# Patient Record
Sex: Male | Born: 1975 | Race: Asian | Hispanic: No | Marital: Married | Smoking: Never smoker
Health system: Southern US, Community
[De-identification: ages and names within clinical notes are randomized; demographics above are authoritative.]

## PROBLEM LIST (undated history)

## (undated) DIAGNOSIS — I10 Essential (primary) hypertension: Secondary | ICD-10-CM

## (undated) DIAGNOSIS — M549 Dorsalgia, unspecified: Secondary | ICD-10-CM

## (undated) DIAGNOSIS — K219 Gastro-esophageal reflux disease without esophagitis: Secondary | ICD-10-CM

## (undated) DIAGNOSIS — G8929 Other chronic pain: Secondary | ICD-10-CM

---

## 2009-11-26 ENCOUNTER — Emergency Department (HOSPITAL_COMMUNITY): Admission: EM | Admit: 2009-11-26 | Discharge: 2009-11-26 | Payer: Self-pay | Admitting: Emergency Medicine

## 2010-02-01 ENCOUNTER — Emergency Department (HOSPITAL_COMMUNITY)
Admission: EM | Admit: 2010-02-01 | Discharge: 2010-02-01 | Payer: Self-pay | Source: Home / Self Care | Admitting: Emergency Medicine

## 2010-02-07 DEATH — deceased

## 2010-02-20 ENCOUNTER — Emergency Department (HOSPITAL_COMMUNITY)
Admission: EM | Admit: 2010-02-20 | Discharge: 2010-02-20 | Payer: Self-pay | Source: Home / Self Care | Admitting: Emergency Medicine

## 2010-02-22 LAB — POCT URINALYSIS DIPSTICK
Bilirubin Urine: NEGATIVE
Hgb urine dipstick: NEGATIVE
Ketones, ur: NEGATIVE mg/dL
Nitrite: NEGATIVE
Protein, ur: NEGATIVE mg/dL
Specific Gravity, Urine: 1.025 (ref 1.005–1.030)
Urine Glucose, Fasting: NEGATIVE mg/dL
Urobilinogen, UA: 0.2 mg/dL (ref 0.0–1.0)
pH: 6 (ref 5.0–8.0)

## 2010-03-12 ENCOUNTER — Inpatient Hospital Stay (INDEPENDENT_AMBULATORY_CARE_PROVIDER_SITE_OTHER)
Admission: RE | Admit: 2010-03-12 | Discharge: 2010-03-12 | Disposition: A | Discharge status: Home / Self Care | Payer: Medicaid Other | Source: Ambulatory Visit | Attending: Family Medicine | Admitting: Family Medicine

## 2010-03-12 DIAGNOSIS — K649 Unspecified hemorrhoids: Secondary | ICD-10-CM

## 2010-04-19 LAB — COMPREHENSIVE METABOLIC PANEL
ALT: 32 U/L (ref 0–53)
AST: 31 U/L (ref 0–37)
Albumin: 3.7 g/dL (ref 3.5–5.2)
Alkaline Phosphatase: 86 U/L (ref 39–117)
BUN: 10 mg/dL (ref 6–23)
CO2: 26 mEq/L (ref 19–32)
Calcium: 9.3 mg/dL (ref 8.4–10.5)
Chloride: 107 mEq/L (ref 96–112)
Creatinine, Ser: 1.18 mg/dL (ref 0.4–1.5)
GFR calc Af Amer: >60 mL/min (ref >60)
GFR calc non Af Amer: >60 mL/min (ref >60)
Glucose, Bld: 86 mg/dL (ref 70–99)
Potassium: 3.6 mEq/L (ref 3.5–5.1)
Sodium: 139 mEq/L (ref 135–145)
Total Bilirubin: 1.1 mg/dL (ref 0.3–1.2)
Total Protein: 7.2 g/dL (ref 6.0–8.3)

## 2010-04-19 LAB — DIFFERENTIAL
Basophils Absolute: 0 10*3/uL (ref 0.0–0.1)
Basophils Relative: 0 % (ref 0–1)
Eosinophils Absolute: 0.3 10*3/uL (ref 0.0–0.7)
Eosinophils Relative: 4 % (ref 0–5)
Lymphocytes Relative: 43 % (ref 12–46)
Lymphs Abs: 3.3 10*3/uL (ref 0.7–4.0)
Monocytes Absolute: 0.5 10*3/uL (ref 0.1–1.0)
Monocytes Relative: 7 % (ref 3–12)
Neutro Abs: 3.6 10*3/uL (ref 1.7–7.7)
Neutrophils Relative %: 46 % (ref 43–77)

## 2010-04-19 LAB — URINALYSIS, ROUTINE W REFLEX MICROSCOPIC
Bilirubin Urine: NEGATIVE
Glucose, UA: NEGATIVE mg/dL
Hgb urine dipstick: NEGATIVE
Ketones, ur: NEGATIVE mg/dL
Nitrite: NEGATIVE
Protein, ur: NEGATIVE mg/dL
Specific Gravity, Urine: 1.016 (ref 1.005–1.030)
Urobilinogen, UA: 0.2 mg/dL (ref 0.0–1.0)
pH: 6 (ref 5.0–8.0)

## 2010-04-19 LAB — CBC
HCT: 47.6 % (ref 39.0–52.0)
Hemoglobin: 16.6 g/dL (ref 13.0–17.0)
MCH: 30.3 pg (ref 26.0–34.0)
MCHC: 34.9 g/dL (ref 30.0–36.0)
MCV: 87 fL (ref 78.0–100.0)
Platelets: 186 10*3/uL (ref 150–400)
RBC: 5.47 MIL/uL (ref 4.22–5.81)
RDW: 12.7 % (ref 11.5–15.5)
WBC: 7.7 10*3/uL (ref 4.0–10.5)

## 2010-04-19 LAB — LIPASE, BLOOD: Lipase: 29 U/L (ref 11–59)

## 2010-04-21 LAB — CBC
HCT: 47.4 % (ref 39.0–52.0)
Hemoglobin: 16.5 g/dL (ref 13.0–17.0)
MCH: 29.9 pg (ref 26.0–34.0)
MCHC: 34.8 g/dL (ref 30.0–36.0)
MCV: 86 fL (ref 78.0–100.0)
Platelets: 200 10*3/uL (ref 150–400)
RBC: 5.51 MIL/uL (ref 4.22–5.81)
RDW: 13 % (ref 11.5–15.5)
WBC: 9.2 10*3/uL (ref 4.0–10.5)

## 2010-04-21 LAB — URINALYSIS, ROUTINE W REFLEX MICROSCOPIC
Bilirubin Urine: NEGATIVE
Glucose, UA: NEGATIVE mg/dL
Hgb urine dipstick: NEGATIVE
Ketones, ur: NEGATIVE mg/dL
Nitrite: NEGATIVE
Protein, ur: NEGATIVE mg/dL
Specific Gravity, Urine: 1.004 — ABNORMAL LOW (ref 1.005–1.030)
Urobilinogen, UA: 0.2 mg/dL (ref 0.0–1.0)
pH: 6 (ref 5.0–8.0)

## 2010-04-21 LAB — LIPASE, BLOOD: Lipase: 33 U/L (ref 11–59)

## 2010-04-21 LAB — COMPREHENSIVE METABOLIC PANEL
ALT: 29 U/L (ref 0–53)
AST: 34 U/L (ref 0–37)
Albumin: 4.5 g/dL (ref 3.5–5.2)
Alkaline Phosphatase: 94 U/L (ref 39–117)
BUN: 7 mg/dL (ref 6–23)
CO2: 26 mEq/L (ref 19–32)
Calcium: 9.5 mg/dL (ref 8.4–10.5)
Chloride: 105 mEq/L (ref 96–112)
Creatinine, Ser: 1.22 mg/dL (ref 0.4–1.5)
GFR calc Af Amer: >60 mL/min (ref >60)
GFR calc non Af Amer: >60 mL/min (ref >60)
Glucose, Bld: 87 mg/dL (ref 70–99)
Potassium: 3.8 mEq/L (ref 3.5–5.1)
Sodium: 139 mEq/L (ref 135–145)
Total Bilirubin: 1.9 mg/dL — ABNORMAL HIGH (ref 0.3–1.2)
Total Protein: 8.1 g/dL (ref 6.0–8.3)

## 2010-04-21 LAB — DIFFERENTIAL
Basophils Absolute: 0 10*3/uL (ref 0.0–0.1)
Basophils Relative: 0 % (ref 0–1)
Eosinophils Absolute: 0.3 10*3/uL (ref 0.0–0.7)
Eosinophils Relative: 3 % (ref 0–5)
Lymphocytes Relative: 25 % (ref 12–46)
Lymphs Abs: 2.3 10*3/uL (ref 0.7–4.0)
Monocytes Absolute: 0.6 10*3/uL (ref 0.1–1.0)
Monocytes Relative: 7 % (ref 3–12)
Neutro Abs: 6 10*3/uL (ref 1.7–7.7)
Neutrophils Relative %: 65 % (ref 43–77)

## 2010-05-18 ENCOUNTER — Inpatient Hospital Stay (INDEPENDENT_AMBULATORY_CARE_PROVIDER_SITE_OTHER)
Admission: RE | Admit: 2010-05-18 | Discharge: 2010-05-18 | Disposition: A | Discharge status: Home / Self Care | Payer: Medicaid Other | Source: Ambulatory Visit | Attending: Family Medicine | Admitting: Family Medicine

## 2010-05-18 DIAGNOSIS — R198 Other specified symptoms and signs involving the digestive system and abdomen: Secondary | ICD-10-CM

## 2010-05-18 DIAGNOSIS — M799 Soft tissue disorder, unspecified: Secondary | ICD-10-CM

## 2010-09-17 ENCOUNTER — Inpatient Hospital Stay (INDEPENDENT_AMBULATORY_CARE_PROVIDER_SITE_OTHER)
Admission: RE | Admit: 2010-09-17 | Discharge: 2010-09-17 | Disposition: A | Discharge status: Home / Self Care | Payer: BC Managed Care – PPO | Source: Ambulatory Visit | Attending: Family Medicine | Admitting: Family Medicine

## 2010-09-17 DIAGNOSIS — K6289 Other specified diseases of anus and rectum: Secondary | ICD-10-CM

## 2010-09-20 LAB — OCCULT BLOOD, POC DEVICE: Fecal Occult Bld: POSITIVE

## 2010-09-30 ENCOUNTER — Emergency Department (HOSPITAL_COMMUNITY)
Admission: EM | Admit: 2010-09-30 | Discharge: 2010-09-30 | Disposition: A | Discharge status: Home / Self Care | Payer: BC Managed Care – PPO | Attending: Emergency Medicine | Admitting: Emergency Medicine

## 2010-09-30 DIAGNOSIS — R059 Cough, unspecified: Secondary | ICD-10-CM | POA: Insufficient documentation

## 2010-09-30 DIAGNOSIS — K921 Melena: Secondary | ICD-10-CM | POA: Insufficient documentation

## 2010-09-30 DIAGNOSIS — R109 Unspecified abdominal pain: Secondary | ICD-10-CM | POA: Insufficient documentation

## 2010-09-30 DIAGNOSIS — K625 Hemorrhage of anus and rectum: Secondary | ICD-10-CM | POA: Insufficient documentation

## 2010-09-30 DIAGNOSIS — R05 Cough: Secondary | ICD-10-CM | POA: Insufficient documentation

## 2010-09-30 DIAGNOSIS — K6289 Other specified diseases of anus and rectum: Secondary | ICD-10-CM | POA: Insufficient documentation

## 2010-09-30 DIAGNOSIS — K5900 Constipation, unspecified: Secondary | ICD-10-CM | POA: Insufficient documentation

## 2010-09-30 DIAGNOSIS — K219 Gastro-esophageal reflux disease without esophagitis: Secondary | ICD-10-CM | POA: Insufficient documentation

## 2010-09-30 DIAGNOSIS — K644 Residual hemorrhoidal skin tags: Secondary | ICD-10-CM | POA: Insufficient documentation

## 2010-09-30 LAB — CBC
HCT: 44.9 % (ref 39.0–52.0)
Hemoglobin: 15.9 g/dL (ref 13.0–17.0)
MCH: 30.8 pg (ref 26.0–34.0)
MCHC: 35.4 g/dL (ref 30.0–36.0)
MCV: 87 fL (ref 78.0–100.0)
Platelets: 164 10*3/uL (ref 150–400)
RBC: 5.16 MIL/uL (ref 4.22–5.81)
RDW: 13.1 % (ref 11.5–15.5)
WBC: 7.4 10*3/uL (ref 4.0–10.5)

## 2010-09-30 LAB — COMPREHENSIVE METABOLIC PANEL
ALT: 56 U/L — ABNORMAL HIGH (ref 0–53)
AST: 58 U/L — ABNORMAL HIGH (ref 0–37)
Albumin: 4 g/dL (ref 3.5–5.2)
Alkaline Phosphatase: 97 U/L (ref 39–117)
BUN: 9 mg/dL (ref 6–23)
CO2: 26 mEq/L (ref 19–32)
Calcium: 9.3 mg/dL (ref 8.4–10.5)
Chloride: 107 mEq/L (ref 96–112)
Creatinine, Ser: 0.95 mg/dL (ref 0.50–1.35)
GFR calc Af Amer: >60 mL/min (ref >60)
GFR calc non Af Amer: >60 mL/min (ref >60)
Glucose, Bld: 104 mg/dL — ABNORMAL HIGH (ref 70–99)
Potassium: 4.7 mEq/L (ref 3.5–5.1)
Sodium: 140 mEq/L (ref 135–145)
Total Bilirubin: 0.8 mg/dL (ref 0.3–1.2)
Total Protein: 8 g/dL (ref 6.0–8.3)

## 2010-09-30 LAB — DIFFERENTIAL
Basophils Absolute: 0 10*3/uL (ref 0.0–0.1)
Basophils Relative: 0 % (ref 0–1)
Eosinophils Absolute: 0.2 10*3/uL (ref 0.0–0.7)
Eosinophils Relative: 2 % (ref 0–5)
Lymphocytes Relative: 32 % (ref 12–46)
Lymphs Abs: 2.3 10*3/uL (ref 0.7–4.0)
Monocytes Absolute: 0.5 10*3/uL (ref 0.1–1.0)
Monocytes Relative: 7 % (ref 3–12)
Neutro Abs: 4.4 10*3/uL (ref 1.7–7.7)
Neutrophils Relative %: 59 % (ref 43–77)

## 2010-09-30 LAB — URINALYSIS, ROUTINE W REFLEX MICROSCOPIC
Bilirubin Urine: NEGATIVE
Glucose, UA: NEGATIVE mg/dL
Hgb urine dipstick: NEGATIVE
Ketones, ur: NEGATIVE mg/dL
Leukocytes, UA: NEGATIVE
Nitrite: NEGATIVE
Protein, ur: NEGATIVE mg/dL
Specific Gravity, Urine: 1.008 (ref 1.005–1.030)
Urobilinogen, UA: 0.2 mg/dL (ref 0.0–1.0)
pH: 6.5 (ref 5.0–8.0)

## 2010-09-30 LAB — LIPASE, BLOOD: Lipase: 47 U/L (ref 11–59)

## 2010-09-30 LAB — OCCULT BLOOD, POC DEVICE: Fecal Occult Bld: POSITIVE

## 2011-02-14 ENCOUNTER — Emergency Department (INDEPENDENT_AMBULATORY_CARE_PROVIDER_SITE_OTHER)
Admission: EM | Admit: 2011-02-14 | Discharge: 2011-02-14 | Disposition: A | Discharge status: Home / Self Care | Payer: BC Managed Care – PPO | Source: Home / Self Care

## 2011-02-14 ENCOUNTER — Encounter: Payer: Self-pay | Admitting: *Deleted

## 2011-02-14 DIAGNOSIS — K219 Gastro-esophageal reflux disease without esophagitis: Secondary | ICD-10-CM

## 2011-02-14 DIAGNOSIS — G8929 Other chronic pain: Secondary | ICD-10-CM

## 2011-02-14 DIAGNOSIS — M545 Low back pain: Secondary | ICD-10-CM

## 2011-02-14 HISTORY — DX: Dorsalgia, unspecified: M54.9

## 2011-02-14 HISTORY — DX: Gastro-esophageal reflux disease without esophagitis: K21.9

## 2011-02-14 HISTORY — DX: Other chronic pain: G89.29

## 2011-02-14 MED ORDER — DICYCLOMINE HCL 20 MG PO TABS: 20.0000 mg | ORAL_TABLET | Freq: Two times a day (BID) | ORAL | Status: DC | PRN

## 2011-02-14 MED ORDER — RANITIDINE HCL 150 MG PO CAPS: 150.0000 mg | ORAL_CAPSULE | Freq: Every day | ORAL | Status: DC

## 2011-02-14 NOTE — ED Notes (Signed)
Pt  Is  A  Somewhat  Vague  Historian  With  Language  Barrier  Interpretor  Phone  uttilised  Pt  Re[ports  Chronic  abd  Pain   Which  He  Reports  Has   Persisted  For  6  Months       Pt  Also  Reports  Vomiting  Yellowish   Material

## 2011-02-14 NOTE — ED Provider Notes (Signed)
History     CSN: [id number]  Arrival date & time 02/14/11  1419   None     Chief Complaint  Patient presents with  . Abdominal Pain    (Consider location/radiation/quality/duration/timing/severity/associated sxs/prior treatment) HPI Comments: Pt states he has had abdominal pain and nausea for approx six months. The pain is in his upper abdomen, burning discomfort and radiates to his chest sometimes. In the mornings when he awakens he feels nauseated but does not vomit. No change in pain with eating but sometimes has abd bloating after eating. He has loose stools 2-3 days per week. Also has low back pain x 6 months - unchanged. Pt admits that he has been seen here at urgent care as well as the ED previously and the medications prescribed help, but when he runs out symptoms return.   The history is provided by the patient. The history is limited by a language barrier. A language interpreter was used [phone]).    Past Medical History  Diagnosis Date  . GERD (gastroesophageal reflux disease)   . Chronic back pain     History reviewed. No pertinent past surgical history.  History reviewed. No pertinent family history.  History  Substance Use Topics  . Smoking status: Not on file  . Smokeless tobacco: Not on file  . Alcohol Use:       Review of Systems  Constitutional: Negative for fever, chills and fatigue.  Respiratory: Negative for cough and shortness of breath.   Cardiovascular: Negative for chest pain.  Gastrointestinal: Positive for nausea, abdominal pain, diarrhea and abdominal distention. Negative for vomiting and constipation.  Musculoskeletal: Positive for back pain.    Allergies  Review of patient's allergies indicates no known allergies.  Home Medications   Current Outpatient Rx  Name Route Sig Dispense Refill  . DICYCLOMINE HCL 20 MG PO TABS Oral Take 1 tablet (20 mg total) by mouth 2 (two) times daily as needed (stomach pain and diarrhea). 20 tablet 0  .  RANITIDINE HCL 150 MG PO CAPS Oral Take 1 capsule (150 mg total) by mouth daily. 60 capsule 0    BP 122/71  Pulse 64  Temp(Src) 98.3 F (36.8 C) (Oral)  Resp 16  SpO2 100%  Physical Exam  Nursing note and vitals reviewed. Constitutional: He appears well-developed and well-nourished. No distress.  HENT:  Head: Normocephalic and atraumatic.  Right Ear: Tympanic membrane, external ear and ear canal normal.  Left Ear: Tympanic membrane, external ear and ear canal normal.  Nose: Nose normal.  Mouth/Throat: Uvula is midline, oropharynx is clear and moist and mucous membranes are normal. No oropharyngeal exudate, posterior oropharyngeal edema or posterior oropharyngeal erythema.  Neck: Neck supple.  Cardiovascular: Normal rate, regular rhythm and normal heart sounds.   Pulmonary/Chest: Effort normal and breath sounds normal. No respiratory distress.  Abdominal: Soft. Bowel sounds are normal. He exhibits no distension and no mass. There is tenderness (epigastric).  Musculoskeletal:       Lumbar back: Normal.  Lymphadenopathy:    He has no cervical adenopathy.  Neurological: He is alert.  Skin: Skin is warm and dry.  Psychiatric: He has a normal mood and affect.    ED Course  Procedures (including critical care time)  Labs Reviewed - No data to display No results found.   1. GERD (gastroesophageal reflux disease)   2. Chronic low back pain       MDM  Reviewed previous ED visits, CT abd & pelvis and labs.  Pt  has been referred to GI on multiple previous visits and has not followed through with making appt. He has also been advised to obtain PCP for f/u. Discussed with pt today the importance of f/u for his chronic abdominal symtoms.        Melody Comas, [place] 02/14/11 1711

## 2011-02-16 NOTE — ED Provider Notes (Signed)
Medical screening examination/treatment/procedure(s) were performed by non-physician practitioner and as supervising physician I was immediately available for consultation/collaboration.  Luiz Blare MD   Luiz Blare, MD 02/16/11 2142

## 2011-10-02 ENCOUNTER — Emergency Department (HOSPITAL_COMMUNITY)
Admission: EM | Admit: 2011-10-02 | Discharge: 2011-10-02 | Disposition: A | Discharge status: Home / Self Care | Payer: BC Managed Care – PPO | Attending: Emergency Medicine | Admitting: Emergency Medicine

## 2011-10-02 ENCOUNTER — Ambulatory Visit (HOSPITAL_COMMUNITY): Admission: RE | Admit: 2011-10-02 | Payer: BC Managed Care – PPO | Source: Ambulatory Visit

## 2011-10-02 ENCOUNTER — Other Ambulatory Visit (HOSPITAL_COMMUNITY): Payer: Self-pay | Admitting: Emergency Medicine

## 2011-10-02 ENCOUNTER — Emergency Department (HOSPITAL_COMMUNITY): Payer: BC Managed Care – PPO

## 2011-10-02 ENCOUNTER — Ambulatory Visit (HOSPITAL_COMMUNITY)
Admission: RE | Admit: 2011-10-02 | Discharge: 2011-10-02 | Disposition: A | Discharge status: Home / Self Care | Payer: BC Managed Care – PPO | Source: Ambulatory Visit | Attending: Emergency Medicine | Admitting: Emergency Medicine

## 2011-10-02 ENCOUNTER — Encounter (HOSPITAL_COMMUNITY): Payer: Self-pay | Admitting: Emergency Medicine

## 2011-10-02 DIAGNOSIS — M25571 Pain in right ankle and joints of right foot: Secondary | ICD-10-CM

## 2011-10-02 DIAGNOSIS — T07XXXA Unspecified multiple injuries, initial encounter: Secondary | ICD-10-CM

## 2011-10-02 DIAGNOSIS — IMO0002 Reserved for concepts with insufficient information to code with codable children: Secondary | ICD-10-CM | POA: Insufficient documentation
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
## 2011-10-22 ENCOUNTER — Emergency Department (INDEPENDENT_AMBULATORY_CARE_PROVIDER_SITE_OTHER)
Admission: EM | Admit: 2011-10-22 | Discharge: 2011-10-22 | Disposition: A | Discharge status: Home / Self Care | Payer: BC Managed Care – PPO | Source: Home / Self Care | Attending: Family Medicine | Admitting: Family Medicine

## 2011-10-22 ENCOUNTER — Encounter (HOSPITAL_COMMUNITY): Payer: Self-pay | Admitting: Emergency Medicine

## 2011-10-22 DIAGNOSIS — S39012A Strain of muscle, fascia and tendon of lower back, initial encounter: Secondary | ICD-10-CM

## 2011-10-22 DIAGNOSIS — S335XXA Sprain of ligaments of lumbar spine, initial encounter: Secondary | ICD-10-CM

## 2011-10-22 MED ORDER — NAPROXEN 500 MG PO TABS: 500.0000 mg | ORAL_TABLET | Freq: Two times a day (BID) | ORAL | Status: AC

## 2011-10-22 MED ORDER — CYCLOBENZAPRINE HCL 10 MG PO TABS: 10.0000 mg | ORAL_TABLET | Freq: Two times a day (BID) | ORAL | Status: AC | PRN

## 2011-10-22 MED ORDER — KETOROLAC TROMETHAMINE 60 MG/2ML IM SOLN
INTRAMUSCULAR | Status: AC
  Filled 2011-10-22: qty 2

## 2011-10-22 MED ORDER — KETOROLAC TROMETHAMINE 60 MG/2ML IM SOLN
60.0000 mg | Freq: Once | INTRAMUSCULAR | Status: AC
  Administered 2011-10-22: 60 mg via INTRAMUSCULAR
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
## 2011-10-22 NOTE — ED Notes (Signed)
C/o of back and body pain x 1 week. Lower back pain movement on left side pain.

## 2011-10-23 NOTE — ED Provider Notes (Signed)
Medical screening examination/treatment/procedure(s) were performed by resident physician or non-physician practitioner and as supervising physician I was immediately available for consultation/collaboration.   Yesenia Locurto DOUGLAS MD.    Janyla Biscoe D Mykaela Arena, MD 10/23/11 0922 

## 2012-03-16 ENCOUNTER — Emergency Department (HOSPITAL_COMMUNITY)
Admission: EM | Admit: 2012-03-16 | Discharge: 2012-03-16 | Disposition: A | Discharge status: Home / Self Care | Payer: BC Managed Care – PPO

## 2013-02-11 ENCOUNTER — Emergency Department (HOSPITAL_COMMUNITY): Payer: Self-pay

## 2013-02-11 ENCOUNTER — Encounter (HOSPITAL_COMMUNITY): Payer: Self-pay | Admitting: Emergency Medicine

## 2013-02-11 ENCOUNTER — Emergency Department (HOSPITAL_COMMUNITY)
Admission: EM | Admit: 2013-02-11 | Discharge: 2013-02-11 | Disposition: A | Discharge status: Home / Self Care | Payer: Self-pay | Attending: Emergency Medicine | Admitting: Emergency Medicine

## 2013-02-11 DIAGNOSIS — J029 Acute pharyngitis, unspecified: Secondary | ICD-10-CM

## 2013-02-11 DIAGNOSIS — R509 Fever, unspecified: Secondary | ICD-10-CM

## 2013-02-11 DIAGNOSIS — R05 Cough: Secondary | ICD-10-CM

## 2013-02-11 DIAGNOSIS — R059 Cough, unspecified: Secondary | ICD-10-CM

## 2013-02-11 DIAGNOSIS — Z8719 Personal history of other diseases of the digestive system: Secondary | ICD-10-CM | POA: Insufficient documentation

## 2013-02-11 DIAGNOSIS — G8929 Other chronic pain: Secondary | ICD-10-CM | POA: Insufficient documentation

## 2013-02-11 DIAGNOSIS — J069 Acute upper respiratory infection, unspecified: Secondary | ICD-10-CM | POA: Insufficient documentation

## 2013-02-11 DIAGNOSIS — R1110 Vomiting, unspecified: Secondary | ICD-10-CM | POA: Insufficient documentation

## 2013-02-11 MED ORDER — IBUPROFEN 200 MG PO TABS
600.0000 mg | ORAL_TABLET | Freq: Once | ORAL | Status: AC
  Administered 2013-02-11: 600 mg via ORAL
  Filled 2013-02-11 (×2): qty 1

## 2013-02-11 NOTE — Discharge Instructions (Signed)
Take ibuprofen and tylenol as needed for pain and fever. If you were given medicines take as directed.  If you are on coumadin or contraceptives realize their levels and effectiveness is altered by many different medicines.  If you have any reaction (rash, tongues swelling, other) to the medicines stop taking and see a physician.   Please follow up as directed and return to the ER or see a physician for new or worsening symptoms.  Thank you.  Hy ibuprofen v paracetamol khi c?n thi?t ?? gi?m ?au v h? s?t . N?u b?n ? cho thu?c take theo ch? d?n. N?u b?n ?ang ? trn coumadin ho?c thai nh?n ra m?c ?? v hi?u qu? ???c thay ??i b?i nhi?u lo?i thu?c khc nhau . N?u b?n c b?t k? ph?n ?ng (pht ban, s?ng l??i , khc ) ?? cc lo?i thu?c ng?ng u?ng v g?p bc s?. Hy theo di theo s? ch? d?n v tr? v? ER ho?c nhn th?y m?t bc s? v? cc tri?u ch?ng m?i ho?c x?u ?i . Cm ?n.

## 2013-02-11 NOTE — ED Provider Notes (Signed)
CSN: [id number]     Arrival date & time 02/11/13  1427 History   First MD Initiated Contact with Patient 02/11/13 1638     Chief Complaint  Patient presents with  . URI   (Consider location/radiation/quality/duration/timing/severity/associated sxs/prior Treatment) HPI Comments: 38 yo male with sinus congestion and cough for two days.  No medical hx except gerd.  No sob or cp.  Mild epig pain.  Intermittent sxs.  No allergies.  No sick contacts or recent travel overseas.   Patient is a 38 y.o. male presenting with URI. The history is provided by the patient.  URI Presenting symptoms: congestion, cough and fever   Associated symptoms: no headaches and no neck pain     Past Medical History  Diagnosis Date  . GERD (gastroesophageal reflux disease)   . Chronic back pain    History reviewed. No pertinent past surgical history. History reviewed. No pertinent family history. History  Substance Use Topics  . Smoking status: Never Smoker   . Smokeless tobacco: Not on file  . Alcohol Use: No    Review of Systems  Constitutional: Positive for fever. Negative for chills.  HENT: Positive for congestion.   Eyes: Negative for visual disturbance.  Respiratory: Positive for cough. Negative for shortness of breath.   Cardiovascular: Negative for chest pain.  Gastrointestinal: Positive for vomiting. Negative for abdominal pain.  Musculoskeletal: Negative for back pain, neck pain and neck stiffness.  Skin: Negative for rash.  Neurological: Negative for light-headedness and headaches.    Allergies  Review of patient's allergies indicates no known allergies.  Home Medications  No current outpatient prescriptions on file. BP 117/65  Pulse 70  Temp(Src) 98.3 F (36.8 C) (Oral)  Resp 18  Wt 162 lb 5 oz (73.624 kg)  SpO2 97% Physical Exam  Nursing note and vitals reviewed. Constitutional: He is oriented to person, place, and time. He appears well-developed and well-nourished.  HENT:   Head: Normocephalic and atraumatic.  Eyes: Conjunctivae are normal. Right eye exhibits no discharge. Left eye exhibits no discharge.  Neck: Normal range of motion. Neck supple. No tracheal deviation present.  Cardiovascular: Normal rate and regular rhythm.   Pulmonary/Chest: Effort normal and breath sounds normal.  Abdominal: Soft. He exhibits no distension. There is no tenderness. There is no guarding.  Musculoskeletal: He exhibits no edema.  Neurological: He is alert and oriented to person, place, and time.  Skin: Skin is warm. No rash noted.  Psychiatric: He has a normal mood and affect.    ED Course  Procedures (including critical care time) Labs Review Labs Reviewed - No data to display Imaging Review Dg Chest 2 View  02/11/2013   CLINICAL DATA:  Upper respiratory infection and cough.  EXAM: CHEST  2 VIEW  COMPARISON:  11/26/2009  FINDINGS: The heart size and mediastinal contours are within normal limits. Both lungs are clear. The visualized skeletal structures are unremarkable.  IMPRESSION: No active cardiopulmonary disease.   Electronically Signed   By: Richarda OverlieAdam  Henn M.D.   On: 02/11/2013 18:43    EKG Interpretation   None       MDM   1. Fever   2. Cough   3. Sore throat    Well appearing. Flu/ URI type sxs. Normal vitals.  CXR no acute findings.  Results and differential diagnosis were discussed with the patient. Close follow up outpatient was discussed, patient comfortable with the plan.   Diagnosis: above    Enid SkeensJoshua M Khyree Carillo, MD 02/11/13 1859

## 2013-02-11 NOTE — ED Notes (Signed)
Pt diod not answer x 1
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
## 2013-02-11 NOTE — ED Notes (Signed)
Pt in xray

## 2013-02-11 NOTE — ED Notes (Signed)
Pt did not answer x 2 

## 2014-05-05 ENCOUNTER — Encounter (HOSPITAL_COMMUNITY): Payer: Self-pay | Admitting: Emergency Medicine

## 2014-05-05 ENCOUNTER — Emergency Department (HOSPITAL_COMMUNITY)
Admission: EM | Admit: 2014-05-05 | Discharge: 2014-05-05 | Disposition: A | Discharge status: Home / Self Care | Payer: No Typology Code available for payment source | Attending: Emergency Medicine | Admitting: Emergency Medicine

## 2014-05-05 DIAGNOSIS — G8929 Other chronic pain: Secondary | ICD-10-CM | POA: Insufficient documentation

## 2014-05-05 DIAGNOSIS — H109 Unspecified conjunctivitis: Secondary | ICD-10-CM | POA: Insufficient documentation

## 2014-05-05 DIAGNOSIS — Z8719 Personal history of other diseases of the digestive system: Secondary | ICD-10-CM | POA: Diagnosis not present

## 2014-05-05 DIAGNOSIS — H5713 Ocular pain, bilateral: Secondary | ICD-10-CM | POA: Diagnosis present

## 2014-05-05 MED ORDER — TETRACAINE HCL 0.5 % OP SOLN
1.0000 [drp] | Freq: Once | OPHTHALMIC | Status: AC
  Administered 2014-05-05: 2 [drp] via OPHTHALMIC
  Filled 2014-05-05: qty 2

## 2014-05-05 MED ORDER — NAPHAZOLINE-PHENIRAMINE 0.025-0.3 % OP SOLN: 1.0000 [drp] | OPHTHALMIC | Status: DC | PRN

## 2014-05-05 MED ORDER — FLUORESCEIN SODIUM 1 MG OP STRP
1.0000 | ORAL_STRIP | Freq: Once | OPHTHALMIC | Status: AC
  Administered 2014-05-05: 1 via OPHTHALMIC
  Filled 2014-05-05: qty 1

## 2014-05-05 NOTE — Discharge Instructions (Signed)
Conjunctivitis °Conjunctivitis is commonly called "pink eye." Conjunctivitis can be caused by bacterial or viral infection, allergies, or injuries. There is usually redness of the lining of the eye, itching, discomfort, and sometimes discharge. There may be deposits of matter along the eyelids. A viral infection usually causes a watery discharge, while a bacterial infection causes a yellowish, thick discharge. Pink eye is very contagious and spreads by direct contact. °You may be given antibiotic eyedrops as part of your treatment. Before using your eye medicine, remove all drainage from the eye by washing gently with warm water and cotton balls. Continue to use the medication until you have awakened 2 mornings in a row without discharge from the eye. Do not rub your eye. This increases the irritation and helps spread infection. Use separate towels from other household members. Wash your hands with soap and water before and after touching your eyes. Use cold compresses to reduce pain and sunglasses to relieve irritation from light. Do not wear contact lenses or wear eye makeup until the infection is gone. °SEEK MEDICAL CARE IF:  °· Your symptoms are not better after 3 days of treatment. °· You have increased pain or trouble seeing. °· The outer eyelids become very red or swollen. °Document Released: 03/03/2004 Document Revised: 04/18/2011 Document Reviewed: 01/24/2005 °[employer]® Patient Information ©2015 [employer]. This information is not intended to replace advice given to you by your health care provider. Make sure you discuss any questions you have with your health care provider. ° °Eye Drops °Use eye drops as directed. It may be easier to have someone help you put the drops in your eye. If you are alone, use the following instructions to help you. °· Wash your hands before putting drops in your eyes. °· Read the label and look at your medication. Check for any expiration date that may appear on the bottle or  tube. Changes of color may be a warning that the medication is old or ineffective. This is especially true if the medication has become brown in color. If you have questions or concerns, call your caregiver. °DROPS °· Tilt your head back with the affected eye uppermost. Gently pull down on your lower lid. Do not pull up on the upper lid. °· Look up. Place the dropper or bottle just over the edge of the lower lid near the white portion at the bottom of the eye. The goal is to have the drop go into the little sac formed by the lower lid and the bottom of the eye itself. Do not release the drop from a height of several inches over the eye. That will only serve to startle the person receiving the medicine when it lands and forces a blink. °· Steady your hand in a comfortable manner. An example would be to hold the dropper or bottle between your thumb and index (pointing) finger. Lean your index finger against the brow. °· Then, slowly and gently squeeze one drop of medication into your eye. °· Once the medication has been applied, place your finger between the lower eyelid and the nose, pressing firmly against the nose for 5-10 seconds. This will slow the process of the eye drop entering the small canal that normally drains tears into the nose, and therefore increases the exposure of the medicine to the eye for a few extra seconds. °OINTMENTS °· Look up. Place the tip of the tube just over the edge of the lower lid near the white portion at the bottom of the   eye. The goal is to create a line of ointment along the inner surface of the eyelid in the little sac formed by the lower lid and the bottom of the eye itself.  Avoid touching the tube tip to your eyeball or eyelid. This avoids contamination of the tube or the medicine in the tube.  Once a line of medicine has been created, hold the upper lid up and look down before releasing the upper lid. This will force the ointment to spread over the surface of the  eye.  Your vision will be very blurry for a few minutes after applying an ointment properly. This is normal and will clear as you continue to blink. For this reason, it is best to apply ointments just before going to sleep, or at a time when you can rest your eyes for 5-10 minutes after applying the medication. GENERAL  Store your medicine in a cool, dry place after each use.  If you need a second medication, wait at least two minutes. This helps the first medication to be taken up (absorbed) by the eye.  If you have been instructed to use both an eye drop and an eye ointment, always apply the drop first and then the ointment 3-4 minutes afterward. Never put medications into the eye unless the label reads, "For Ophthalmic Use," "For Use In Eyes" or "Eye Drops." If you have questions, call your caregiver. Document Released: 05/02/2000 Document Revised: 06/10/2013 Document Reviewed: 07/08/2008 [facility] Patient Information 2015 [place], [place]. This information is not intended to replace advice given to you by your health care provider. Make sure you discuss any questions you have with your health care provider.   Please use your eyedrops as directed to help with your eye itching. Please follow-up with [place] and wellness in order to establish care for further evaluation and management of your symptoms. Return to ED for worsening symptoms.

## 2014-05-05 NOTE — ED Provider Notes (Signed)
CSN: [id number]     Arrival date & time 05/05/14  1109 History   This chart was scribed for non-physician practitioner, Joycie PeekBenjamin Macintyre Alexa, PA-C, working with Zadie Rhineonald Wickline, MD by Charline BillsEssence Howell, ED Scribe. This patient was seen in room TR04C/TR04C and the patient's care was started at 12:17 PM.   Chief Complaint  Patient presents with  . Eye Pain   The history is provided by the patient. The history is limited by a language barrier. A language interpreter was used.  HPI Comments: Charles Watson is a 39 y.o. male who presents to the Emergency Department complaining of persistent bilateral eye itching for the past week. Pt states that he is unsure of the cause of eye itching. Patient reports he is rubbing his eyes constantly. Pt also reports intermittent HAs. He denies eye pain, foreign body, fever, rhinorrhea, cough. No treatments tried PTA. No alleviating or aggravating factors. No known allergies.   Past Medical History  Diagnosis Date  . GERD (gastroesophageal reflux disease)   . Chronic back pain    History reviewed. No pertinent past surgical history. No family history on file. History  Substance Use Topics  . Smoking status: Never Smoker   . Smokeless tobacco: Not on file  . Alcohol Use: No    Review of Systems  Constitutional: Negative for fever.  HENT: Negative for rhinorrhea.   Eyes: Positive for itching. Negative for pain.  Respiratory: Negative for cough.   Neurological: Positive for headaches.   Allergies  Review of patient's allergies indicates no known allergies.  Home Medications   Prior to Admission medications   Medication Sig Start Date End Date Taking? Authorizing Provider  naphazoline-pheniramine (NAPHCON-A) 0.025-0.3 % ophthalmic solution Place 1 drop into both eyes every 4 (four) hours as needed for irritation. 05/05/14   Joycie PeekBenjamin Lillan Mccreadie, PA-C   BP 125/83 mmHg  Pulse 87  Temp(Src) 98 F (36.7 C) (Oral)  Resp 16  SpO2 97% Physical Exam  Constitutional:  He is oriented to person, place, and time. He appears well-developed and well-nourished. No distress.  HENT:  Head: Normocephalic and atraumatic.  Eyes: EOM are normal. Pupils are equal, round, and reactive to light. Right eye exhibits no discharge and no exudate. Left eye exhibits no discharge and no exudate. No scleral icterus. Right eye exhibits no nystagmus. Left eye exhibits no nystagmus.  Slit lamp exam:      The right eye shows no corneal abrasion and no foreign body.       The left eye shows no corneal abrasion and no foreign body.  Mild bulbar conjunctivitis bilaterally. No exudate. No periorbital tenderness or erythema. Extraocular movements intact without discomfort. On Woods lamp evaluation there are no corneal abrasions or other foreign bodies noted.  Neck: Normal range of motion. Neck supple. No tracheal deviation present.  Cardiovascular: Normal rate.   Pulmonary/Chest: Effort normal. No respiratory distress.  Musculoskeletal: Normal range of motion.  Neurological: He is alert and oriented to person, place, and time.  Skin: Skin is warm and dry.  Psychiatric: He has a normal mood and affect. His behavior is normal.  Nursing note and vitals reviewed.  ED Course  Procedures (including critical care time) DIAGNOSTIC STUDIES: Oxygen Saturation is 97% on RA, normal by my interpretation.    COORDINATION OF CARE: 12:33 PM-Discussed treatment plan which includes fluorescein exam and eye drops with pt at bedside and pt agreed to plan.   Labs Review Labs Reviewed - No data to display  Imaging Review No  results found.   EKG Interpretation None     Meds given in ED:  Medications  tetracaine (PONTOCAINE) 0.5 % ophthalmic solution 1-2 drop (2 drops Both Eyes Given 05/05/14 1209)  fluorescein ophthalmic strip 1 strip (1 strip Both Eyes Given 05/05/14 1209)  fluorescein ophthalmic strip 1 strip (1 strip Both Eyes Given by Other 05/05/14 1230)    Discharge Medication List as of  05/05/2014 12:51 PM    START taking these medications   Details  naphazoline-pheniramine (NAPHCON-A) 0.025-0.3 % ophthalmic solution Place 1 drop into both eyes every 4 (four) hours as needed for irritation., Starting 05/05/2014, Until Discontinued, Print       Filed Vitals:   05/05/14 1116  BP: 125/83  Pulse: 87  Temp: 98 F (36.7 C)  TempSrc: Oral  Resp: 16  SpO2: 97%    MDM  Vitals stable - WNL -afebrile Pt resting comfortably in ED. PE--physical exam consistent with allergic conjunctivitis. No evidence of other acute or emergent pathology. We'll treat with Naphcon ophthalmic drops.  I discussed all relevant lab findings and imaging results with pt and they verbalized understanding. Discussed f/u with PCP within 48 hrs and return precautions, pt very amenable to plan. Patient stable, in good condition and ambulates out of the ED without difficulty  Final diagnoses:  Bilateral conjunctivitis   I personally performed the services described in this documentation, which was scribed in my presence. The recorded information has been reviewed and is accurate.    Joycie Peek, PA-C 05/05/14 1656  Zadie Rhine, MD 05/07/14 [phone]

## 2014-05-05 NOTE — ED Notes (Signed)
Discharge instruction and diagnosis reviewed with pt per interpretor by Candlewood KnollsBen, Georgia[place].

## 2014-05-05 NOTE — ED Notes (Signed)
INFORMATION OBTAINED VIA PACIFIC INTERPRETOR.

## 2014-05-05 NOTE — ED Notes (Signed)
PT DOES NOT SPEAK ENGLISH. WILL NEED [place] TRANSLATOR.

## 2014-05-05 NOTE — ED Notes (Signed)
Pt states he woke with eye irritating and itching about 1 week ago. No known trauma. Left eye appears redder than right.

## 2014-06-24 ENCOUNTER — Encounter (HOSPITAL_COMMUNITY): Payer: Self-pay | Admitting: Emergency Medicine

## 2014-06-24 ENCOUNTER — Emergency Department (HOSPITAL_COMMUNITY)
Admission: EM | Admit: 2014-06-24 | Discharge: 2014-06-24 | Disposition: A | Discharge status: Home / Self Care | Payer: No Typology Code available for payment source | Attending: Emergency Medicine | Admitting: Emergency Medicine

## 2014-06-24 ENCOUNTER — Emergency Department (HOSPITAL_COMMUNITY): Payer: No Typology Code available for payment source

## 2014-06-24 DIAGNOSIS — Z8719 Personal history of other diseases of the digestive system: Secondary | ICD-10-CM | POA: Diagnosis not present

## 2014-06-24 DIAGNOSIS — J069 Acute upper respiratory infection, unspecified: Secondary | ICD-10-CM | POA: Insufficient documentation

## 2014-06-24 DIAGNOSIS — Z8619 Personal history of other infectious and parasitic diseases: Secondary | ICD-10-CM | POA: Insufficient documentation

## 2014-06-24 DIAGNOSIS — L299 Pruritus, unspecified: Secondary | ICD-10-CM | POA: Insufficient documentation

## 2014-06-24 DIAGNOSIS — G8929 Other chronic pain: Secondary | ICD-10-CM | POA: Insufficient documentation

## 2014-06-24 DIAGNOSIS — R21 Rash and other nonspecific skin eruption: Secondary | ICD-10-CM | POA: Diagnosis present

## 2014-06-24 MED ORDER — CLOTRIMAZOLE 1 % EX CREA: TOPICAL_CREAM | CUTANEOUS | Status: DC

## 2014-06-24 NOTE — ED Notes (Signed)
Pt waiting to speak with Registration.

## 2014-06-24 NOTE — Discharge Instructions (Signed)
Cough, Adult  A cough is a reflex that helps clear your throat and airways. It can help heal the body or may be a reaction to an irritated airway. A cough may only last 2 or 3 weeks (acute) or may last more than 8 weeks (chronic).  CAUSES Acute cough:  Viral or bacterial infections. Chronic cough:  Infections.  Allergies.  Asthma.  Post-nasal drip.  Smoking.  Heartburn or acid reflux.  Some medicines.  Chronic lung problems (COPD).  Cancer. SYMPTOMS   Cough.  Fever.  Chest pain.  Increased breathing rate.  High-pitched whistling sound when breathing (wheezing).  Colored mucus that you cough up (sputum). TREATMENT   A bacterial cough may be treated with antibiotic medicine.  A viral cough must run its course and will not respond to antibiotics.  Your caregiver may recommend other treatments if you have a chronic cough. HOME CARE INSTRUCTIONS   Only take over-the-counter or prescription medicines for pain, discomfort, or fever as directed by your caregiver. Use cough suppressants only as directed by your caregiver.  Use a cold steam vaporizer or humidifier in your bedroom or home to help loosen secretions.  Sleep in a semi-upright position if your cough is worse at night.  Rest as needed.  Stop smoking if you smoke. SEEK IMMEDIATE MEDICAL CARE IF:   You have pus in your sputum.  Your cough starts to worsen.  You cannot control your cough with suppressants and are losing sleep.  You begin coughing up blood.  You have difficulty breathing.  You develop pain which is getting worse or is uncontrolled with medicine.  You have a fever. MAKE SURE YOU:   Understand these instructions.  Will watch your condition.  Will get help right away if you are not doing well or get worse. Document Released: 07/23/2010 Document Revised: 04/18/2011 Document Reviewed: 07/23/2010 [facility]ExitCare Patient Information 2015 [place], [place]. This information is not intended  to replace advice given to you by your health care provider. Make sure you discuss any questions you have with your health care provider. Body Ringworm Ringworm (tinea corporis) is a fungal infection of the skin on the body. This infection is not caused by worms, but is actually caused by a fungus. Fungus normally lives on the top of your skin and can be useful. However, in the case of ringworms, the fungus grows out of control and causes a skin infection. It can involve any area of skin on the body and can spread easily from one person to another (contagious). Ringworm is a common problem for children, but it can affect adults as well. Ringworm is also often found in athletes, especially wrestlers who share equipment and mats.  CAUSES  Ringworm of the body is caused by a fungus called dermatophyte. It can spread by:  Touchingother people who are infected.  Touchinginfected pets.  Touching or sharingobjects that have been in contact with the infected person or pet (hats, combs, towels, clothing, sports equipment). SYMPTOMS   Itchy, raised red spots and bumps on the skin.  Ring-shaped rash.  Redness near the border of the rash with a clear center.  Dry and scaly skin on or around the rash. Not every person develops a ring-shaped rash. Some develop only the red, scaly patches. DIAGNOSIS  Most often, ringworm can be diagnosed by performing a skin exam. Your caregiver may choose to take a skin scraping from the affected area. The sample will be examined under the microscope to see if the fungus  is present.  TREATMENT  Body ringworm may be treated with a topical antifungal cream or ointment. Sometimes, an antifungal shampoo that can be used on your body is prescribed. You may be prescribed antifungal medicines to take by mouth if your ringworm is severe, keeps coming back, or lasts a long time.  HOME CARE INSTRUCTIONS   Only take over-the-counter or prescription medicines as directed by your  caregiver.  Wash the infected area and dry it completely before applying yourcream or ointment.  When using antifungal shampoo to treat the ringworm, leave the shampoo on the body for 3-5 minutes before rinsing.   Wear loose clothing to stop clothes from rubbing and irritating the rash.  Wash or change your bed sheets every night while you have the rash.  Have your pet treated by your veterinarian if it has the same infection. To prevent ringworm:   Practice good hygiene.  Wear sandals or shoes in public places and showers.  Do not share personal items with others.  Avoid touching red patches of skin on other people.  Avoid touching pets that have bald spots or wash your hands after doing so. SEEK MEDICAL CARE IF:   Your rash continues to spread after 7 days of treatment.  Your rash is not gone in 4 weeks.  The area around your rash becomes red, warm, tender, and swollen. Document Released: 01/22/2000 Document Revised: 10/19/2011 Document Reviewed: 08/08/2011 [facility]ExitCare Patient Information 2015 [place], [place]. This information is not intended to replace advice given to you by your health care provider. Make sure you discuss any questions you have with your health care provider.

## 2014-06-24 NOTE — ED Provider Notes (Signed)
CSN: [id number]     Arrival date & time 06/24/14  1100 History  This chart was scribed for Charles MangleKaren Sophia, PA-C, working with Purvis SheffieldForrest Harrison, MD by Charles Watson, ED Scribe. This patient was seen in room TR10C/TR10C and the patient's care was started at 11:40 AM.     No chief complaint on file.  The history is provided by the patient. A language interpreter was used.   HPI Comments: Charles Watson is a 39 y.o. male who presents to the Emergency Department complaining of an itching, spreading rash to his groin and buttocks onset 1 week ago.  The itching is worse at night. Patient has not used any medications for this complaint.  pT ALSO COMPLAINS OF A COUGH.   pT DENIES FEVER, COUGH IS WORSE AT NIGHT.  PCP: none.  Past Medical History  Diagnosis Date  . GERD (gastroesophageal reflux disease)   . Chronic back pain    History reviewed. No pertinent past surgical history. No family history on file. History  Substance Use Topics  . Smoking status: Never Smoker   . Smokeless tobacco: Not on file  . Alcohol Use: No    Review of Systems  Skin: Positive for rash.  All other systems reviewed and are negative.     Allergies  Review of patient's allergies indicates no known allergies.  Home Medications   Prior to Admission medications   Medication Sig Start Date End Date Taking? Authorizing Provider  naphazoline-pheniramine (NAPHCON-A) 0.025-0.3 % ophthalmic solution Place 1 drop into both eyes every 4 (four) hours as needed for irritation. 05/05/14   Charles PeekBenjamin Cartner, PA-C   There were no vitals taken for this visit. Physical Exam  Constitutional: He is oriented to person, place, and time. He appears well-developed and well-nourished. No distress.  HENT:  Head: Normocephalic and atraumatic.  Eyes: Conjunctivae and EOM are normal.  Neck: Neck supple. No tracheal deviation present.  Cardiovascular: Normal rate.   Pulmonary/Chest: Effort normal. No respiratory distress.  Lungs CTA   Abdominal: Soft.  Musculoskeletal: Normal range of motion.  Neurological: He is alert and oriented to person, place, and time.  Skin: Skin is dry. Rash noted.  Pruritic, dry, scaly-appearing rash on buttocks and groin.      Psychiatric: He has a normal mood and affect. His behavior is normal.  Nursing note and vitals reviewed.   ED Course  Procedures (including critical care time)  DIAGNOSTIC STUDIES: Oxygen Saturation is 100% on RA, normal by my interpretation.    COORDINATION OF CARE:  11:44 AM Discussed treatment plan with patient at bedside.  Patient acknowledges and agrees with plan.    Labs Review Labs Reviewed - No data to display  Imaging Review Dg Chest 2 View  06/24/2014   CLINICAL DATA:  Cough, chest pain for 1 week  EXAM: CHEST  2 VIEW  COMPARISON:  02/11/2013  FINDINGS: Cardiomediastinal silhouette is stable. No acute infiltrate or pleural effusion. No pulmonary edema. Bony thorax is unremarkable.  IMPRESSION: No active cardiopulmonary disease.   Electronically Signed   By: Natasha MeadLiviu  Pop M.D.   On: 06/24/2014 12:14     EKG Interpretation None      MDM   Final diagnoses:  URI (upper respiratory infection)  History of tinea corporis   LOTRIMIN Pt advised to return if any problemns.   Charles SkinnerLeslie K KalifornskySofia, PA-C 06/24/14 1521  Purvis SheffieldForrest Harrison, MD 06/24/14 [phone]

## 2014-06-24 NOTE — ED Notes (Signed)
Pt c/o itching rash to arms, chest, groin and legs. Onset 1 week ago. Pt speaks [place].

## 2015-05-13 ENCOUNTER — Emergency Department (HOSPITAL_COMMUNITY): Payer: Self-pay

## 2015-05-13 ENCOUNTER — Encounter (HOSPITAL_COMMUNITY): Payer: Self-pay

## 2015-05-13 ENCOUNTER — Emergency Department (HOSPITAL_COMMUNITY)
Admission: EM | Admit: 2015-05-13 | Discharge: 2015-05-13 | Disposition: A | Discharge status: Home / Self Care | Payer: Self-pay | Attending: Emergency Medicine | Admitting: Emergency Medicine

## 2015-05-13 DIAGNOSIS — J029 Acute pharyngitis, unspecified: Secondary | ICD-10-CM | POA: Insufficient documentation

## 2015-05-13 DIAGNOSIS — R000 Tachycardia, unspecified: Secondary | ICD-10-CM | POA: Insufficient documentation

## 2015-05-13 DIAGNOSIS — R110 Nausea: Secondary | ICD-10-CM | POA: Insufficient documentation

## 2015-05-13 DIAGNOSIS — Z8719 Personal history of other diseases of the digestive system: Secondary | ICD-10-CM | POA: Insufficient documentation

## 2015-05-13 DIAGNOSIS — H938X3 Other specified disorders of ear, bilateral: Secondary | ICD-10-CM | POA: Insufficient documentation

## 2015-05-13 DIAGNOSIS — Z79899 Other long term (current) drug therapy: Secondary | ICD-10-CM | POA: Insufficient documentation

## 2015-05-13 DIAGNOSIS — J3489 Other specified disorders of nose and nasal sinuses: Secondary | ICD-10-CM | POA: Insufficient documentation

## 2015-05-13 DIAGNOSIS — G8929 Other chronic pain: Secondary | ICD-10-CM | POA: Insufficient documentation

## 2015-05-13 LAB — CBC WITH DIFFERENTIAL/PLATELET
BASOS PCT: 0 %
Basophils Absolute: 0 10*3/uL (ref 0.0–0.1)
EOS PCT: 1 %
Eosinophils Absolute: 0.1 10*3/uL (ref 0.0–0.7)
HCT: 48.2 % (ref 39.0–52.0)
Hemoglobin: 16.1 g/dL (ref 13.0–17.0)
Lymphocytes Relative: 17 %
Lymphs Abs: 2.2 10*3/uL (ref 0.7–4.0)
MCH: 28.9 pg (ref 26.0–34.0)
MCHC: 33.4 g/dL (ref 30.0–36.0)
MCV: 86.5 fL (ref 78.0–100.0)
MONO ABS: 1.5 10*3/uL — AB (ref 0.1–1.0)
Monocytes Relative: 12 %
Neutro Abs: 9.3 10*3/uL — ABNORMAL HIGH (ref 1.7–7.7)
Neutrophils Relative %: 70 %
PLATELETS: 171 10*3/uL (ref 150–400)
RBC: 5.57 MIL/uL (ref 4.22–5.81)
RDW: 13 % (ref 11.5–15.5)
WBC: 13.1 10*3/uL — ABNORMAL HIGH (ref 4.0–10.5)

## 2015-05-13 LAB — BASIC METABOLIC PANEL
Anion gap: 12 (ref 5–15)
BUN: 10 mg/dL (ref 6–20)
CALCIUM: 9.4 mg/dL (ref 8.9–10.3)
CO2: 23 mmol/L (ref 22–32)
Chloride: 104 mmol/L (ref 101–111)
Creatinine, Ser: 1.63 mg/dL — ABNORMAL HIGH (ref 0.61–1.24)
GFR calc Af Amer: 59 mL/min — ABNORMAL LOW (ref >60)
GFR, EST NON AFRICAN AMERICAN: 51 mL/min — AB (ref >60)
GLUCOSE: 115 mg/dL — AB (ref 65–99)
Potassium: 4.3 mmol/L (ref 3.5–5.1)
Sodium: 139 mmol/L (ref 135–145)

## 2015-05-13 LAB — I-STAT CG4 LACTIC ACID, ED: Lactic Acid, Venous: 1.14 mmol/L (ref 0.5–2.0)

## 2015-05-13 LAB — RAPID STREP SCREEN (MED CTR MEBANE ONLY): STREPTOCOCCUS, GROUP A SCREEN (DIRECT): NEGATIVE

## 2015-05-13 LAB — MONONUCLEOSIS SCREEN: MONO SCREEN: NEGATIVE

## 2015-05-13 MED ORDER — CLINDAMYCIN PHOSPHATE 600 MG/50ML IV SOLN
600.0000 mg | Freq: Once | INTRAVENOUS | Status: AC
  Administered 2015-05-13: 600 mg via INTRAVENOUS
  Filled 2015-05-13: qty 50

## 2015-05-13 MED ORDER — ACETAMINOPHEN 325 MG PO TABS
650.0000 mg | ORAL_TABLET | Freq: Once | ORAL | Status: AC
  Administered 2015-05-13: 650 mg via ORAL
  Filled 2015-05-13: qty 2

## 2015-05-13 MED ORDER — DEXAMETHASONE SODIUM PHOSPHATE 10 MG/ML IJ SOLN
10.0000 mg | Freq: Once | INTRAMUSCULAR | Status: AC
  Administered 2015-05-13: 10 mg via INTRAVENOUS
  Filled 2015-05-13: qty 1

## 2015-05-13 MED ORDER — HYDROCODONE-ACETAMINOPHEN 7.5-325 MG/15ML PO SOLN: 10.0000 mL | Freq: Four times a day (QID) | ORAL | Status: DC | PRN

## 2015-05-13 MED ORDER — CLINDAMYCIN HCL 150 MG PO CAPS: 150.0000 mg | ORAL_CAPSULE | Freq: Four times a day (QID) | ORAL | Status: DC

## 2015-05-13 MED ORDER — IOPAMIDOL (ISOVUE-300) INJECTION 61%
INTRAVENOUS | Status: AC
  Administered 2015-05-13: 75 mL
  Filled 2015-05-13: qty 75

## 2015-05-13 MED ORDER — SODIUM CHLORIDE 0.9 % IV BOLUS (SEPSIS)
1000.0000 mL | Freq: Once | INTRAVENOUS | Status: AC
  Administered 2015-05-13: 1000 mL via INTRAVENOUS

## 2015-05-13 NOTE — ED Notes (Signed)
Patient able to ambulate independently  

## 2015-05-13 NOTE — ED Provider Notes (Signed)
CSN: [id number]     Arrival date & time 05/13/15  1059 History  By signing my name below, I, Freida Busmaniana Omoyeni, attest that this documentation has been prepared under the direction and in the presence of non-physician practitioner, Fayrene HelperBowie Carolynne Schuchard, PA-C. Electronically Signed: Freida Busmaniana Omoyeni, Scribe. 05/13/2015. 11:56 AM.     Chief Complaint  Patient presents with  . Sore Throat   The history is provided by the patient. A language interpreter was used [place]ali).    HPI Comments:  Charles Watson is a 40 y.o. male who presents to the Emergency Department complaining of constant, sore throat x 3 days. Pt rates his pain a 8-9/10. He reports associated dysphagia, subjective fever, rhinorrhea, sneezing, and nausea. Pt states he was given a white pill at work for his fever but is unsure what he took. He denies vomiting, and diarrhea. Does report trouble swallowing.  He's not a smoker.  Denies any recent sick contact or any recent travel.  No cp, sob, abd pain or rash.     Past Medical History  Diagnosis Date  . GERD (gastroesophageal reflux disease)   . Chronic back pain    History reviewed. No pertinent past surgical history. No family history on file. Social History  Substance Use Topics  . Smoking status: Never Smoker   . Smokeless tobacco: None  . Alcohol Use: No    Review of Systems  Constitutional: Positive for fever (subjective).  HENT: Positive for rhinorrhea, sneezing, sore throat and trouble swallowing.   Gastrointestinal: Positive for nausea. Negative for vomiting and diarrhea.    Allergies  Review of patient's allergies indicates no known allergies.  Home Medications   Prior to Admission medications   Medication Sig Start Date End Date Taking? Authorizing Provider  clotrimazole (LOTRIMIN) 1 % cream Apply to affected area 2 times daily 06/24/14   Elson AreasLeslie K Sofia, PA-C  naphazoline-pheniramine (NAPHCON-A) 0.025-0.3 % ophthalmic solution Place 1 drop into both eyes every 4 (four) hours as  needed for irritation. 05/05/14   Joycie PeekBenjamin Cartner, PA-C   BP 137/86 mmHg  Pulse 92  Temp(Src) 100.8 F (38.2 C) (Oral)  Resp 18  Wt 170 lb 8 oz (77.338 kg)  SpO2 98% Physical Exam  Constitutional: He is oriented to person, place, and time. He appears well-developed and well-nourished. No distress.  HENT:  Head: Normocephalic and atraumatic.  Right Ear: Tympanic membrane is erythematous and bulging.  Left Ear: Tympanic membrane is erythematous and bulging.  Nose: Rhinorrhea present.  Mouth/Throat: Uvula is midline. No trismus in the jaw. Posterior oropharyngeal erythema present.  No tonsillar enlargement   Eyes: Conjunctivae are normal.  Neck: No rigidity.  Cardiovascular: Regular rhythm and normal heart sounds.  Tachycardia present.  Exam reveals no gallop and no friction rub.   No murmur heard. Pulmonary/Chest: Effort normal and breath sounds normal. No respiratory distress. He has no wheezes. He has no rales.  Abdominal: He exhibits no distension.  Neurological: He is alert and oriented to person, place, and time.  Skin: Skin is warm and dry.  Psychiatric: He has a normal mood and affect.  Nursing note and vitals reviewed.   ED Course  Procedures  DIAGNOSTIC STUDIES:  Oxygen Saturation is 98% on RA, normal by my interpretation.    COORDINATION OF CARE:  11:51 AM Discussed treatment plan with pt at bedside and pt agreed to plan.  Labs Review Labs Reviewed  BASIC METABOLIC PANEL - Abnormal; Notable for the following:    Glucose, Bld 115 (*)  Creatinine, Ser 1.63 (*)    GFR calc non Af Amer 51 (*)    GFR calc Af Amer 59 (*)    All other components within normal limits  CBC WITH DIFFERENTIAL/PLATELET - Abnormal; Notable for the following:    WBC 13.1 (*)    Neutro Abs 9.3 (*)    Monocytes Absolute 1.5 (*)    All other components within normal limits  RAPID STREP SCREEN (NOT AT [facility])  CULTURE, GROUP A STREP [facility])  MONONUCLEOSIS SCREEN  I-STAT CG4 LACTIC ACID,  ED    Imaging Review Ct Soft Tissue Neck W Contrast  05/13/2015  CLINICAL DATA:  Sore throat. Difficulty swallowing, and fever for 3 days. EXAM: CT NECK WITH CONTRAST TECHNIQUE: Multidetector CT imaging of the neck was performed using the standard protocol following the bolus administration of intravenous contrast. CONTRAST:  75mL ISOVUE-300 IOPAMIDOL (ISOVUE-300) INJECTION 61% COMPARISON:  None. FINDINGS: Pharynx and larynx: There is moderate fullness of the palatine tonsils, left greater than right. The lingual tonsils are mildly enlarged as well. There is no discrete abscess. No other focal mucosal or submucosal lesions are evident. The adenoid tissue is within normal limits for age. Salivary glands: The submandibular and parotid glands are within normal limits bilaterally. Thyroid: Negative Lymph nodes: Enlarged bilateral level 2 lymph nodes appear reactive. Enlarged submandibular lymph nodes are present bilaterally as well. Vascular: Negative. Limited intracranial: Unremarkable. Visualized orbits: Within normal limits. Mastoids and visualized paranasal sinuses: Mild mucosal thickening is present in the anterior ethmoid air cells and frontal sinuses. Mucosal thickening is present in the right maxillary sinus with partial obstruction of the ostiomeatal complex. Lucency is noted about the roots of the right mandibular molars. This could be related to the sinus disease. Skeleton: Mild endplate degenerative changes are present at C3-4. Vertebral body heights and alignment are maintained. Upper chest: The lung apices are clear. The superior mediastinum is unremarkable. IMPRESSION: 1. Mucosal thickening and fullness in the oropharynx, left greater than right. No focal abscess is present. 2. Enlarged level 1 B and level 2 lymph nodes appear reactive. 3. Right maxillary sinus and tooth disease. 4. Minimal degenerative endplate changes are present at C3-4. Electronically Signed   By: Marin Roberts M.D.   On:  05/13/2015 15:14   I have personally reviewed and evaluated these images and lab results as part of my medical decision-making.   EKG Interpretation None      MDM     Final diagnoses:  Pharyngitis    BP 101/58 mmHg  Pulse 82  Temp(Src) 98.2 F (36.8 C) (Oral)  Resp 16  Wt 77.338 kg  SpO2 100%  I personally performed the services described in this documentation, which was scribed in my presence. The recorded information has been reviewed and is accurate.     N[place]ali speaking patient presents with complaints of sore throat, difficulty swallowing. On initial exam he does not have any significant airway compromise and no trismus however he was tachycardic and appears uncomfortable. Due to the language barrier, workup initiated including strep screen, mono screen, labs, along with a neck soft tissue CT scan. He also received Tylenol for his fever. He does have a mild leukocytosis with WBC 13.1 without left shift. Evidence of mild renal insufficiency with creatinine of 1.63 and GFR of 59. Has a normal lactic acid normal strep and mono screen. Strep culture sent. His neck soft tissue CT scan demonstrate mucosal thickening and fullness in the oropharynx but no evidence of abscess noted.  He does have some reactive lymph nodes. Also evidence of sinus and tooth disease.  After receiving IV fluid and Tylenol, his vital signs stabilized, no fever, and tachycardia resolved. Patient did receive clindamycin PIV in the ER and will continue taking antibiotic at home. Pt understand to return in 48 hours if symptoms worsen. ENT referral given as needed. Return precaution discussed.   Fayrene Helper, PA-C 05/13/15 1553  Margarita Grizzle, MD 05/13/15 [phone]

## 2015-05-13 NOTE — ED Notes (Signed)
PA at bedside updating patient with interpreter phone

## 2015-05-13 NOTE — ED Notes (Signed)
Pt up to restroom.  Gait steady and even.   

## 2015-05-13 NOTE — Discharge Instructions (Signed)

## 2015-05-13 NOTE — ED Notes (Addendum)
Patient here with inability to swallow and complains of sore throat x 1 day. Throat swollen and difficulty visualizing, hoarse

## 2015-05-15 LAB — CULTURE, GROUP A STREP (THRC)

## 2015-06-11 ENCOUNTER — Encounter (HOSPITAL_COMMUNITY): Payer: Self-pay | Admitting: Emergency Medicine

## 2015-06-11 ENCOUNTER — Emergency Department (HOSPITAL_COMMUNITY)
Admission: EM | Admit: 2015-06-11 | Discharge: 2015-06-11 | Disposition: A | Discharge status: Home / Self Care | Payer: No Typology Code available for payment source | Attending: Emergency Medicine | Admitting: Emergency Medicine

## 2015-06-11 DIAGNOSIS — G8929 Other chronic pain: Secondary | ICD-10-CM | POA: Insufficient documentation

## 2015-06-11 DIAGNOSIS — H1013 Acute atopic conjunctivitis, bilateral: Secondary | ICD-10-CM | POA: Insufficient documentation

## 2015-06-11 DIAGNOSIS — Z8719 Personal history of other diseases of the digestive system: Secondary | ICD-10-CM | POA: Insufficient documentation

## 2015-06-11 DIAGNOSIS — J309 Allergic rhinitis, unspecified: Secondary | ICD-10-CM | POA: Insufficient documentation

## 2015-06-11 DIAGNOSIS — Z79899 Other long term (current) drug therapy: Secondary | ICD-10-CM | POA: Insufficient documentation

## 2015-06-11 MED ORDER — TETRACAINE HCL 0.5 % OP SOLN
2.0000 [drp] | Freq: Once | OPHTHALMIC | Status: DC
  Filled 2015-06-11: qty 2

## 2015-06-11 MED ORDER — FLUORESCEIN SODIUM 1 MG OP STRP
1.0000 | ORAL_STRIP | Freq: Once | OPHTHALMIC | Status: AC
  Administered 2015-06-11: 1 via OPHTHALMIC
  Filled 2015-06-11: qty 1

## 2015-06-11 MED ORDER — PREDNISOLONE ACETATE 1 % OP SUSP
1.0000 [drp] | Freq: Once | OPHTHALMIC | Status: AC
  Administered 2015-06-11: 1 [drp] via OPHTHALMIC
  Filled 2015-06-11: qty 1

## 2015-06-11 NOTE — ED Notes (Signed)
Pt called x2 by Coralee NorthNina, EMT. No answer

## 2015-06-11 NOTE — ED Notes (Signed)
Patient left at this time with all belongings. 

## 2015-06-11 NOTE — ED Provider Notes (Signed)
CSN: [id number]     Arrival date & time 06/11/15  1847 History   By signing my name below, I, Freida Busmaniana Omoyeni, attest that this documentation has been prepared under the direction and in the presence of non-physician practitioner, Wynetta EmeryNicole Eilidh Marcano, PA-C. Electronically Signed: Freida Busmaniana Omoyeni, Scribe. 06/11/2015. 11:04 PM.   Chief Complaint  Patient presents with  . Eye Problem   The history is provided by the patient. A language interpreter was used [place]).     HPI Comments:  Niel HummerBal Pascarella is a 40 y.o. male who presents to the Emergency Department complaining of bilateral eye itching and redness x 2 weeks. Pt notes he has been rubbing his eye excessively due to the itching. Pt reports associated crusting/drainage from the eyes when he wakes in the AM.  He denies sick contacts at home with similar symptoms. He also denies use of contact lenses. No alleviating factors noted. He denies vision change, fever, chills, nausea, and vomiting. He states he has been using polytrim eye drops without relief. He used the drops twice today. He has been using it for ~ 8 days. Pt is not a native english speaker; language line interpreter used to obtain history and ROS.   Past Medical History  Diagnosis Date  . GERD (gastroesophageal reflux disease)   . Chronic back pain    History reviewed. No pertinent past surgical history. No family history on file. Social History  Substance Use Topics  . Smoking status: Never Smoker   . Smokeless tobacco: None  . Alcohol Use: No    Review of Systems  10 systems reviewed and all are negative for acute change except as noted in the HPI.   Allergies  Review of patient's allergies indicates no known allergies.  Home Medications   Prior to Admission medications   Medication Sig Start Date End Date Taking? Authorizing Provider  clindamycin (CLEOCIN) 150 MG capsule Take 1 capsule (150 mg total) by mouth every 6 (six) hours. 05/13/15   Fayrene HelperBowie Tran, PA-C  clotrimazole  (LOTRIMIN) 1 % cream Apply to affected area 2 times daily 06/24/14   Elson AreasLeslie K Sofia, PA-C  HYDROcodone-acetaminophen (HYCET) 7.5-325 mg/15 ml solution Take 10 mLs by mouth every 6 (six) hours as needed for moderate pain. 05/13/15   Fayrene HelperBowie Tran, PA-C  naphazoline-pheniramine (NAPHCON-A) 0.025-0.3 % ophthalmic solution Place 1 drop into both eyes every 4 (four) hours as needed for irritation. 05/05/14   Benjamin Cartner, PA-C   BP 134/88 mmHg  Pulse 58  Temp(Src) 98 F (36.7 C) (Oral)  Resp 16  SpO2 100% Physical Exam  Constitutional: He is oriented to person, place, and time. He appears well-developed and well-nourished. No distress.  HENT:  Head: Normocephalic and atraumatic.  Eyes: EOM are normal. Pupils are equal, round, and reactive to light.  Bilateral conjunctival injection pupils equal round reactive to light, extraocular movement is intact, normal lids and lashes. Patient can count fingers, grossly normal visual acuity. No abnormal uptake on fluorescein stain, no photophobia.  Cardiovascular: Normal rate.   Pulmonary/Chest: Effort normal. No stridor.  Abdominal: He exhibits no distension.  Musculoskeletal: Normal range of motion.  Neurological: He is alert and oriented to person, place, and time.  Skin: Skin is warm and dry.  Psychiatric: He has a normal mood and affect.  Nursing note and vitals reviewed.   ED Course  Procedures   DIAGNOSTIC STUDIES:  Oxygen Saturation is 100% on RA, normal by my interpretation.    COORDINATION OF CARE:  10:52 PM  Dr. Jeraldine Loots assessed pt at bedside   11:05 PM Long conversation had with pt via interpreter instructing pt to follow up with ophthalmology and to discontinue use of polytrim eye drops and to use only eye drops prescribed today in ED 2-4 times a day. Discussed treatment plan with pt at bedside and pt agreed to plan.   Labs Review Labs Reviewed - No data to display  Imaging Review No results found. I have personally reviewed and  evaluated these images and lab results as part of my medical decision-making.   MDM   Final diagnoses:  Allergic conjunctivitis and rhinitis, bilateral    Filed Vitals:   06/11/15 2139  BP: 134/88  Pulse: 58  Temp: 98 F (36.7 C)  TempSrc: Oral  Resp: 16  SpO2: 100%    Medications  tetracaine (PONTOCAINE) 0.5 % ophthalmic solution 2 drop (not administered)  fluorescein ophthalmic strip 1 strip (1 strip Both Eyes Given 06/11/15 2320)  prednisoLONE acetate (PRED FORTE) 1 % ophthalmic suspension 1 drop (1 drop Both Eyes Given 06/11/15 2319)    Albin Duckett is 40 y.o. male presenting with Bilateral eye erythema onset 2 weeks ago. Patient states that he obtained Polytrim over-the-counter at [facility], states that he's been using when necessary for itching. States it is not improving his symptoms. Grossly normal visual acuity, no abnormal uptake on fluorescein stain. Discussed with attending physician who has also evaluated this patient and thinks this may be related to an allergic  Evaluation does not show pathology that would require ongoing emergent intervention or inpatient treatment. Pt is hemodynamically stable and mentating appropriately. Discussed findings and plan with patient/guardian, who agrees with care plan. All questions answered. Return precautions discussed and outpatient follow up given.    I personally performed the services described in this documentation, which was scribed in my presence. The recorded information has been reviewed and is accurate.    Wynetta Emery, PA-C 06/12/15 0021  Gerhard Munch, MD 06/12/15 2129

## 2015-06-11 NOTE — ED Notes (Signed)
Pt reports itching to L eye ongoing x2 eyes. Redness noted to bilateral eyes. Language barrier noted.

## 2016-05-21 ENCOUNTER — Encounter (HOSPITAL_COMMUNITY): Payer: Self-pay | Admitting: Emergency Medicine

## 2016-05-21 ENCOUNTER — Emergency Department (HOSPITAL_COMMUNITY)
Admission: EM | Admit: 2016-05-21 | Discharge: 2016-05-21 | Disposition: A | Discharge status: Home / Self Care | Payer: Medicaid Other | Attending: Emergency Medicine | Admitting: Emergency Medicine

## 2016-05-21 DIAGNOSIS — H1011 Acute atopic conjunctivitis, right eye: Secondary | ICD-10-CM | POA: Diagnosis not present

## 2016-05-21 DIAGNOSIS — H1012 Acute atopic conjunctivitis, left eye: Secondary | ICD-10-CM | POA: Diagnosis not present

## 2016-05-21 DIAGNOSIS — H578 Other specified disorders of eye and adnexa: Secondary | ICD-10-CM | POA: Diagnosis present

## 2016-05-21 DIAGNOSIS — H1013 Acute atopic conjunctivitis, bilateral: Secondary | ICD-10-CM

## 2016-05-21 MED ORDER — FLUORESCEIN SODIUM 0.6 MG OP STRP
1.0000 | ORAL_STRIP | Freq: Once | OPHTHALMIC | Status: AC
  Administered 2016-05-21: 1 via OPHTHALMIC
  Filled 2016-05-21: qty 1

## 2016-05-21 MED ORDER — TETRACAINE HCL 0.5 % OP SOLN
2.0000 [drp] | Freq: Once | OPHTHALMIC | Status: AC
  Administered 2016-05-21: 2 [drp] via OPHTHALMIC
  Filled 2016-05-21: qty 2

## 2016-05-21 MED ORDER — AZELASTINE HCL 0.05 % OP SOLN: 1.0000 [drp] | Freq: Two times a day (BID) | OPHTHALMIC | 0 refills | Status: DC

## 2016-05-21 NOTE — ED Provider Notes (Signed)
[facility] Provider Note   CSN: [id number] Arrival date & time: 05/21/16  2017     History   Chief Complaint Chief Complaint  Patient presents with  . Eye Problem    HPI Charles Watson is a 41 y.o. male.  Patient c/o bilateral eye itching and redness for the past week. Symptoms moderate, persistent.  States similar symptoms last year.  No change in vision. No fevers.   The history is provided by the patient.  Eye Problem   Associated symptoms include eye redness.    Past Medical History:  Diagnosis Date  . Chronic back pain   . GERD (gastroesophageal reflux disease)     There are no active problems to display for this patient.   History reviewed. No pertinent surgical history.     Home Medications    Prior to Admission medications   Medication Sig Start Date End Date Taking? Authorizing Provider  clindamycin (CLEOCIN) 150 MG capsule Take 1 capsule (150 mg total) by mouth every 6 (six) hours. 05/13/15   Fayrene Helper, PA-C  clotrimazole (LOTRIMIN) 1 % cream Apply to affected area 2 times daily 06/24/14   Elson Areas, PA-C  HYDROcodone-acetaminophen (HYCET) 7.5-325 mg/15 ml solution Take 10 mLs by mouth every 6 (six) hours as needed for moderate pain. 05/13/15   Fayrene Helper, PA-C  naphazoline-pheniramine (NAPHCON-A) 0.025-0.3 % ophthalmic solution Place 1 drop into both eyes every 4 (four) hours as needed for irritation. 05/05/14   Joycie Peek, PA-C    Family History History reviewed. No pertinent family history.  Social History Social History  Substance Use Topics  . Smoking status: Never Smoker  . Smokeless tobacco: Current User  . Alcohol use No     Allergies   Patient has no known allergies.   Review of Systems Review of Systems  Constitutional: Negative for fever.  HENT: Negative for sore throat.   Eyes: Positive for redness and itching.     Physical Exam Updated Vital Signs BP (!) 145/104 (BP Location: Left Arm)   Pulse 71   Temp  98.2 F (36.8 C) (Oral)   Resp 16   Ht 5' 4.5" (1.638 m)   Wt 77.4 kg   SpO2 98%   BMI 28.82 kg/m   Physical Exam  Constitutional: He appears well-developed and well-nourished. No distress.  HENT:  Nose: Nose normal.  Mouth/Throat: Oropharynx is clear and moist.  Eyes: Right eye exhibits no discharge. Left eye exhibits no discharge.  Conjunctiva injected bilaterally. No purulent drainage. No orbital or periorbital cellulitis.  fluorescein neg.   Neck: Neck supple. No tracheal deviation present.  Cardiovascular: Normal rate.   Pulmonary/Chest: Effort normal. No accessory muscle usage. No respiratory distress.  Musculoskeletal: He exhibits no edema.  Neurological: He is alert.  Skin: Skin is warm and dry. No rash noted. He is not diaphoretic.  Psychiatric: He has a normal mood and affect.  Nursing note and vitals reviewed.    ED Treatments / Results  Labs (all labs ordered are listed, but only abnormal results are displayed) Labs Reviewed - No data to display  EKG  EKG Interpretation None       Radiology No results found.  Procedures Procedures (including critical care time)  Medications Ordered in ED Medications  tetracaine (PONTOCAINE) 0.5 % ophthalmic solution 2 drop (not administered)  fluorescein ophthalmic strip 1 strip (not administered)     Initial Impression / Assessment and Plan / ED Course  I have reviewed the triage vital  signs and the nursing notes.  Pertinent labs & imaging results that were available during my care of the patient were reviewed by me and considered in my medical decision making (see chart for details).  Symptoms felt most c/w allergy conjunctivitis. Will give rx.   Final Clinical Impressions(s) / ED Diagnoses   Final diagnoses:  None    New Prescriptions New Prescriptions   No medications on file     Charles Laine, MD 05/21/16 2219

## 2016-05-21 NOTE — ED Notes (Signed)
Pt understood dc material. NAD noted. script given at dc 

## 2016-05-21 NOTE — ED Triage Notes (Signed)
Pt presents to ED for assessment of bilateral eye itching.  All triage done with Nepali interpreter.  Pt denies any injury to eyes, just states eye itchiness without pain x 1 week.  Pt states this happened to him last year, they gave him some drops while he was here, and he felt better.

## 2016-05-21 NOTE — Discharge Instructions (Signed)
It was our pleasure to provide your ER care today - we hope that you feel better.  Avoid rubbing or scratching at eyes.   Use eye drops as prescribed.  You may take benadryl as need.  Follow up with eye specialist in coming week if symptoms fail to improve/resolve.  Return to ER if worse, new symptoms, severe eye pain, change in vision, other concern.

## 2017-05-12 ENCOUNTER — Emergency Department (HOSPITAL_COMMUNITY)
Admission: EM | Admit: 2017-05-12 | Discharge: 2017-05-12 | Disposition: A | Discharge status: Home / Self Care | Payer: No Typology Code available for payment source | Attending: Emergency Medicine | Admitting: Emergency Medicine

## 2017-05-12 ENCOUNTER — Other Ambulatory Visit: Payer: Self-pay

## 2017-05-12 ENCOUNTER — Encounter (HOSPITAL_COMMUNITY): Payer: Self-pay

## 2017-05-12 DIAGNOSIS — Z79899 Other long term (current) drug therapy: Secondary | ICD-10-CM | POA: Insufficient documentation

## 2017-05-12 DIAGNOSIS — H1013 Acute atopic conjunctivitis, bilateral: Secondary | ICD-10-CM | POA: Insufficient documentation

## 2017-05-12 MED ORDER — CETIRIZINE HCL 10 MG PO TABS: 10.0000 mg | ORAL_TABLET | Freq: Every day | ORAL | 0 refills | Status: DC

## 2017-05-12 MED ORDER — AZELASTINE HCL 0.05 % OP SOLN: 1.0000 [drp] | Freq: Two times a day (BID) | OPHTHALMIC | 0 refills | Status: DC

## 2017-05-12 NOTE — Discharge Instructions (Addendum)
You have seasonal allergies.  Apply 1 drop of azelastine to each eye, 2 times per day. You can zyrtec (cetirizine) once daily. You can get this medication over the counter as well. Establish a primary care doctor for management of your seasonal allergies.  ????? ????? ?????? ?? ???????? ?????? ??????????? 1 ????? ???? ?????????, ????? ??? 2 ???? ????? zyrtec (cetirizine) ?? ???? ??? ??? ??????????? ????? ?? ???? ???????? ??? ??????? ???? ??????????? ??????? ????? ???????? ???????????? ???? ?? ???????? ?????? ???????? ??????? ?????????? Tap?'?? mausam? ?larj? cha. Praty?ka ?m?kh?m? aj?las?inak? 1 ?r?pa l?g? garnuh?s, prati dina 2 pa?aka. Tap?'?? zyrtec (cetirizine) ?ka pal?a ?kai dina saknuhuncha. Tap?'?? y? au?adh? k?'u??aram? pani pr?pta garna saknuhuncha. Tap?'??k? mausam? ?larj?k? vyavasth?panak? l?gi ?ka pr?thamika h?rac?ha cikitsaka sth?pan? garnuh?s.

## 2017-05-12 NOTE — ED Notes (Signed)
PT states understanding of care given, follow up care, and medication prescribed. PT ambulated from ED to car with a steady gait. 

## 2017-05-12 NOTE — ED Notes (Signed)
Pt called,no answer.

## 2017-05-12 NOTE — ED Triage Notes (Signed)
Pt endorses bilateral eye irritation x 1 week. No visual changes. VSS

## 2017-05-12 NOTE — ED Provider Notes (Signed)
[facility] EMERGENCY DEPARTMENT Provider Note   CSN: [id number] Arrival date & time: 05/12/17  1742     History   Chief Complaint Chief Complaint  Patient presents with  . Eye Pain    HPI Charles Watson is a 42 y.o. male presenting to the ED for 1 week of bilateral eye itching.  Patient states he has these symptoms every year around this time, and was prescribed eyedrops last year which provided relief.  Patient is here requesting prescription for same eyedrops.  He denies any foreign body, purulent drainage, or pain.  Notes that eyes are itchy with watery discharge.  Assoc runny nose. Denies vision changes. Per chart review, patient patient has been evaluated in the ED in May 2017 and April 2018 with similar complaint and diagnosed with allergic conjunctivitis with rhinitis.  The history is provided by the patient. The history is limited by a language barrier. A language interpreter was used.    Past Medical History:  Diagnosis Date  . Chronic back pain   . GERD (gastroesophageal reflux disease)     There are no active problems to display for this patient.   History reviewed. No pertinent surgical history.      Home Medications    Prior to Admission medications   Medication Sig Start Date End Date Taking? Authorizing Provider  azelastine (OPTIVAR) 0.05 % ophthalmic solution Place 1 drop into both eyes 2 (two) times daily. 05/12/17   Robinson, [place] N, PA-C  cetirizine (ZYRTEC ALLERGY) 10 MG tablet Take 1 tablet (10 mg total) by mouth daily. 05/12/17   Robinson, [place] N, PA-C  clindamycin (CLEOCIN) 150 MG capsule Take 1 capsule (150 mg total) by mouth every 6 (six) hours. 05/13/15   Fayrene Helper, PA-C  clotrimazole (LOTRIMIN) 1 % cream Apply to affected area 2 times daily 06/24/14   Elson Areas, PA-C  HYDROcodone-acetaminophen (HYCET) 7.5-325 mg/15 ml solution Take 10 mLs by mouth every 6 (six) hours as needed for moderate pain. 05/13/15   Fayrene Helper, PA-C    naphazoline-pheniramine (NAPHCON-A) 0.025-0.3 % ophthalmic solution Place 1 drop into both eyes every 4 (four) hours as needed for irritation. 05/05/14   Joycie Peek, PA-C    Family History History reviewed. No pertinent family history.  Social History Social History   Tobacco Use  . Smoking status: Never Smoker  . Smokeless tobacco: Current User  Substance Use Topics  . Alcohol use: No  . Drug use: No     Allergies   Patient has no known allergies.   Review of Systems Review of Systems  HENT: Positive for rhinorrhea.   Eyes: Positive for discharge (water), redness and itching. Negative for pain and visual disturbance.     Physical Exam Updated Vital Signs BP (!) 142/90 (BP Location: Right Arm)   Pulse 65   Temp 98.3 F (36.8 C) (Oral)   Resp 16   Ht 5\' 5"  (1.651 m)   Wt 79.4 kg (175 lb)   SpO2 100%   BMI 29.12 kg/m   Physical Exam  Constitutional: He appears well-developed and well-nourished. No distress.  HENT:  Head: Normocephalic and atraumatic.  Eyes: Pupils are equal, round, and reactive to light. EOM and lids are normal.  Bilateral conjunctiva are red.  No purulent drainage.  No foreign bodies visualized.  No periorbital edema.  Cardiovascular: Normal rate.  Pulmonary/Chest: Effort normal.  Psychiatric: He has a normal mood and affect. His behavior is normal.  Nursing note and vitals  reviewed.    ED Treatments / Results  Labs (all labs ordered are listed, but only abnormal results are displayed) Labs Reviewed - No data to display  EKG None  Radiology No results found.  Procedures Procedures (including critical care time)  Medications Ordered in ED Medications - No data to display   Initial Impression / Assessment and Plan / ED Course  I have reviewed the triage vital signs and the nursing notes.  Pertinent labs & imaging results that were available during my care of the patient were reviewed by me and considered in my medical  decision making (see chart for details).     Patient with symptoms consistent with allergic conjunctivitis and rhinitis.  Patient with previous ED visits with similar presentation.  Bilateral conjunctiva are red, no purulent drainage.  No foreign bodies.  Will discharge with antihistamine eyedrops, p.o. allergy medications.  Recommend patient establish primary care.  Safe for discharge.  Discussed results, findings, treatment and follow up. Patient advised of return precautions. Patient verbalized understanding and agreed with plan.  Final Clinical Impressions(s) / ED Diagnoses   Final diagnoses:  Allergic conjunctivitis of both eyes    ED Discharge Orders        Ordered    azelastine (OPTIVAR) 0.05 % ophthalmic solution  2 times daily     05/12/17 2022    cetirizine (ZYRTEC ALLERGY) 10 MG tablet  Daily     05/12/17 2022       Robinson, [place] N, PA-C 05/12/17 2029    Arby BarrettePfeiffer, Marcy, MD 05/14/17 2153

## 2017-05-12 NOTE — ED Notes (Signed)
Patient is A&Ox4.  No signs of distress noted.  Please see providers complete history and physical exam.  

## 2017-06-16 ENCOUNTER — Ambulatory Visit (INDEPENDENT_AMBULATORY_CARE_PROVIDER_SITE_OTHER): Payer: Self-pay

## 2017-06-16 ENCOUNTER — Ambulatory Visit (HOSPITAL_COMMUNITY)
Admission: EM | Admit: 2017-06-16 | Discharge: 2017-06-16 | Disposition: A | Discharge status: Home / Self Care | Payer: Self-pay | Attending: Family Medicine | Admitting: Family Medicine

## 2017-06-16 ENCOUNTER — Encounter (HOSPITAL_COMMUNITY): Payer: Self-pay | Admitting: Family Medicine

## 2017-06-16 DIAGNOSIS — M7918 Myalgia, other site: Secondary | ICD-10-CM

## 2017-06-16 MED ORDER — CYCLOBENZAPRINE HCL 5 MG PO TABS: 5.0000 mg | ORAL_TABLET | Freq: Three times a day (TID) | ORAL | 0 refills | Status: DC | PRN

## 2017-06-16 MED ORDER — IBUPROFEN 800 MG PO TABS: 800.0000 mg | ORAL_TABLET | Freq: Three times a day (TID) | ORAL | 0 refills | Status: DC | PRN

## 2017-06-16 NOTE — Discharge Instructions (Addendum)
Ice to area Take the ibuprofen 3 x a day with food This is for pain and inflammation Take the flexeril as needed muscle spasm It can cause drowsiness Use arm as tolerated, avoid overhead and heavy use See orthopedic if not better in a week or two
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
## 2017-06-18 ENCOUNTER — Emergency Department (HOSPITAL_COMMUNITY): Payer: Self-pay

## 2017-06-18 ENCOUNTER — Encounter (HOSPITAL_COMMUNITY): Payer: Self-pay | Admitting: Emergency Medicine

## 2017-06-18 ENCOUNTER — Emergency Department (HOSPITAL_COMMUNITY)
Admission: EM | Admit: 2017-06-18 | Discharge: 2017-06-18 | Disposition: A | Discharge status: Home / Self Care | Payer: Self-pay | Attending: Emergency Medicine | Admitting: Emergency Medicine

## 2017-06-18 DIAGNOSIS — S4991XA Unspecified injury of right shoulder and upper arm, initial encounter: Secondary | ICD-10-CM

## 2017-06-18 DIAGNOSIS — M25511 Pain in right shoulder: Secondary | ICD-10-CM | POA: Insufficient documentation

## 2017-06-18 DIAGNOSIS — M25562 Pain in left knee: Secondary | ICD-10-CM | POA: Insufficient documentation

## 2017-06-18 MED ORDER — LIDOCAINE 5 % EX PTCH: 1.0000 | MEDICATED_PATCH | CUTANEOUS | 0 refills | Status: DC

## 2017-06-18 MED ORDER — METHOCARBAMOL 500 MG PO TABS: 500.0000 mg | ORAL_TABLET | Freq: Two times a day (BID) | ORAL | 0 refills | Status: DC

## 2017-06-18 NOTE — ED Notes (Signed)
Pt verbalizes understanding of d/c instructions. Pt received prescriptions. Pt ambulatory at d/c with all belongings.  

## 2017-06-18 NOTE — ED Notes (Signed)
See EDP assessment 

## 2017-06-18 NOTE — ED Notes (Signed)
Attempt made to call pt for triage no respond gotten, lobby and bathroom verify no pt responding for triage.

## 2017-06-18 NOTE — Discharge Instructions (Addendum)
Take it easy, but do not lay around too much as this may make any stiffness worse.  °Antiinflammatory medications: Take 600 mg of ibuprofen every 6 hours or 440 mg (over the counter dose) to 500 mg (prescription dose) of naproxen every 12 hours for the next 3 days. After this time, these medications may be used as needed for pain. Take these medications with food to avoid upset stomach. Choose only one of these medications, do not take them together.  °Tylenol: Should you continue to have additional pain while taking the ibuprofen or naproxen, you may add in tylenol as needed. Your daily total maximum amount of tylenol from all sources should be limited to 4000mg/day for persons without liver problems, or 2000mg/day for those with liver problems. °Muscle relaxer: Robaxin is a muscle relaxer and may help loosen stiff muscles. Do not take the Robaxin while driving or performing other dangerous activities.  °Lidocaine patches: These are available via either prescription or over-the-counter. The over-the-counter option may be more economical one and are likely just as effective. There are multiple over-the-counter brands, such as Salonpas. °Exercises: Be sure to perform the attached exercises starting with three times a week and working up to performing them daily. This is an essential part of preventing long term problems.  ° °Follow up with a primary care provider for any future management of these complaints. °
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
## 2018-01-12 ENCOUNTER — Ambulatory Visit (HOSPITAL_COMMUNITY)
Admission: EM | Admit: 2018-01-12 | Discharge: 2018-01-12 | Disposition: A | Discharge status: Home / Self Care | Payer: BLUE CROSS/BLUE SHIELD | Attending: Family Medicine | Admitting: Family Medicine

## 2018-01-12 ENCOUNTER — Encounter (HOSPITAL_COMMUNITY): Payer: Self-pay

## 2018-01-12 DIAGNOSIS — K047 Periapical abscess without sinus: Secondary | ICD-10-CM | POA: Diagnosis not present

## 2018-01-12 MED ORDER — AMOXICILLIN-POT CLAVULANATE 875-125 MG PO TABS: 1.0000 | ORAL_TABLET | Freq: Two times a day (BID) | ORAL | 0 refills | Status: AC

## 2018-01-12 MED ORDER — IBUPROFEN 800 MG PO TABS: 800.0000 mg | ORAL_TABLET | Freq: Three times a day (TID) | ORAL | 0 refills | Status: AC | PRN

## 2018-01-12 NOTE — Discharge Instructions (Signed)
?????????? ?????????? ???? ?? ???? ?? ??? ????????? ?? ??? ??? ????? ???? °???????? ?????????? ????????? ?? ???? ???? ?? ??? ?? ?? °??? ???????? ?????? ????????? ??? ???? ?? ??? ???? ?? ??????? ???? °????? ???????? ?????? ???? ???? °??? ?? ????????? ?? ????? ?? ??? ????? ????? ?? ??? ??? ???????? ?? ??? ???? ???? (??????? ?????) °??? ???? ??? ??? ?? ??????? ??? ????? ???? ?????, ??? ????, ?????? ??? ??????, ??????? ??????, ????? ?? ????? ??? ????, ????, ?????, ???? ??? ????, ?????, ??? ?? ?? ??? ?? ???? ???? ?? ?????  °  ibuprophen nirdhaarit. dard se raahat ke lie nirdeshit ke roop mein upayog karen ogamentin nirdhaarit. nirdeshit aur poora karane ke lie le lo dant chikitsak dvaara moolyaankan kie jaane tak naram aahaar kee siphaarish karen maukhik svachchhata dekhabhaal banae rakhen aage ke moolyaankan aur upachaar ke lie jitanee jaldee ho sake dant chikitsak ke saath paalan karen (haindaut shaamil) agar aapako koee naya ya bigadata hua lakshan jaise bukhaar, thand lagana, nigalane mein [place], dardanaak [place], maukhik ya gardan mein [place], [place], [place], seene mein dard, [place], [place] ho to eedee par vaapas [employer]oughjaen ya jaen.

## 2018-01-12 NOTE — ED Provider Notes (Signed)
[facility]   [id number] 01/12/18 Arrival Time: 1619  CC: DENTAL PAIN  SUBJECTIVE: HPI obtained using interpretor [id number]; patient hard of hearing, and difficult to get a thorough HPI  Niel HummerBal Weisel is a 42 y.o. male who presents with dental pain that began 4 days ago.  Denies a precipitating event or trauma.  Localizes pain to left lower gum.  Has NOT tried OTC medications.  Worse with chewing.  Does not see a dentist regularly.  Denies similar symptoms in the past.  Denies fever, chills, dysphagia, odynophagia, oral or neck swelling, nausea, vomiting, chest pain, SOB.    ROS: As per HPI.  Past Medical History:  Diagnosis Date  . Chronic back pain   . GERD (gastroesophageal reflux disease)    History reviewed. No pertinent surgical history. No Known Allergies No current facility-administered medications on file prior to encounter.    Current Outpatient Medications on File Prior to Encounter  Medication Sig Dispense Refill  . cetirizine (ZYRTEC ALLERGY) 10 MG tablet Take 1 tablet (10 mg total) by mouth daily. 30 tablet 0   Social History   Socioeconomic History  . Marital status: Married    Spouse name: Not on file  . Number of children: Not on file  . Years of education: Not on file  . Highest education level: Not on file  Occupational History  . Not on file  Social Needs  . Financial resource strain: Not on file  . Food insecurity:    Worry: Not on file    Inability: Not on file  . Transportation needs:    Medical: Not on file    Non-medical: Not on file  Tobacco Use  . Smoking status: Never Smoker  . Smokeless tobacco: Current User  Substance and Sexual Activity  . Alcohol use: No  . Drug use: No  . Sexual activity: Not on file  Lifestyle  . Physical activity:    Days per week: Not on file    Minutes per session: Not on file  . Stress: Not on file  Relationships  . Social connections:    Talks on phone: Not on file    Gets together: Not on file    Attends religious service: Not on file    Active member of club or organization: Not on file    Attends meetings of clubs or organizations: Not on file    Relationship status: Not on file  . Intimate partner violence:    Fear of current or ex partner: Not on file    Emotionally abused: Not on file    Physically abused: Not on file    Forced sexual activity: Not on file  Other Topics Concern  . Not on file  Social History Narrative  . Not on file   History reviewed. No pertinent family history.  OBJECTIVE:  Vitals:   01/12/18 1647  BP: (!) 136/94  Pulse: 80  Resp: 18  Temp: 98.1 F (36.7 C)  TempSrc: Oral  SpO2: 100%    General appearance: alert; appears uncomfortable HENT: normocephalic; atraumatic; dentition: multiple carries; abscess present- left lower over left lower gums without areas of fluctuance Neck: supple without LAD Lungs: normal respirations Skin: warm and dry Psychological: alert and cooperative; normal mood and affect      ASSESSMENT & PLAN:  1. Dental abscess     Meds ordered this encounter  Medications  . amoxicillin-clavulanate (AUGMENTIN) 875-125 MG tablet    Sig: Take 1 tablet by mouth 2 (two)  times daily for 10 days.    Dispense:  20 tablet    Refill:  0    Order Specific Question:   Supervising Provider    Answer:   Eustace Moore [[id number]]  . ibuprofen (ADVIL,MOTRIN) 800 MG tablet    Sig: Take 1 tablet (800 mg total) by mouth every 8 (eight) hours as needed for moderate pain.    Dispense:  30 tablet    Refill:  0    Order Specific Question:   Supervising Provider    Answer:   Eustace Moore [[id number]]   Ibuprofen prescribed.  Use as directed for pain relief Augmentin prescribed.  Take as directed and to completion Recommend soft diet until evaluated by dentist Maintain oral hygiene care Follow up with dentist as soon as possible for further evaluation and treatment (handout included) Return or go to the ED if you have any  new or worsening symptoms such as fever, chills, difficulty swallowing, painful swallowing, oral or neck swelling, nausea, vomiting, chest pain, SOB, etc...  Reviewed expectations re: course of current medical issues. Questions answered. Outlined signs and symptoms indicating need for more acute intervention. Patient verbalized understanding. After Visit Summary given.   Rennis Harding, PA-C 01/12/18 1835

## 2018-01-12 NOTE — ED Triage Notes (Signed)
Pt presents with oral pain.

## 2018-05-07 ENCOUNTER — Encounter (HOSPITAL_COMMUNITY): Payer: Self-pay | Admitting: *Deleted

## 2018-05-07 ENCOUNTER — Emergency Department (HOSPITAL_COMMUNITY)
Admission: EM | Admit: 2018-05-07 | Discharge: 2018-05-07 | Disposition: A | Discharge status: Home / Self Care | Payer: BLUE CROSS/BLUE SHIELD | Attending: Emergency Medicine | Admitting: Emergency Medicine

## 2018-05-07 ENCOUNTER — Other Ambulatory Visit: Payer: Self-pay

## 2018-05-07 DIAGNOSIS — I10 Essential (primary) hypertension: Secondary | ICD-10-CM | POA: Insufficient documentation

## 2018-05-07 DIAGNOSIS — H1013 Acute atopic conjunctivitis, bilateral: Secondary | ICD-10-CM

## 2018-05-07 DIAGNOSIS — Z79899 Other long term (current) drug therapy: Secondary | ICD-10-CM | POA: Insufficient documentation

## 2018-05-07 HISTORY — DX: Essential (primary) hypertension: I10

## 2018-05-07 MED ORDER — AZELASTINE HCL 0.05 % OP SOLN
1.0000 [drp] | Freq: Two times a day (BID) | OPHTHALMIC | 0 refills | Status: DC
Start: 2018-05-07 — End: 2018-07-20

## 2018-05-07 MED ORDER — AZELASTINE HCL 0.05 % OP SOLN: 1.0000 [drp] | Freq: Two times a day (BID) | OPHTHALMIC | 12 refills | Status: DC

## 2018-05-07 MED ORDER — CETIRIZINE HCL 10 MG PO TABS
10.0000 mg | ORAL_TABLET | Freq: Every day | ORAL | 0 refills | Status: DC
Start: 2018-05-07 — End: 2018-05-07

## 2018-05-07 MED ORDER — CETIRIZINE HCL 10 MG PO TABS: 10.0000 mg | ORAL_TABLET | Freq: Every day | ORAL | 0 refills | Status: DC

## 2018-05-07 NOTE — Discharge Instructions (Addendum)
It was my pleasure taking care of you today!   Use eye drops twice daily.  Take cetirizine daily.  Warm compresses to the eyes can help as well.   Return to ER for new or worsening symptoms, any additional concerns.

## 2018-05-07 NOTE — ED Provider Notes (Signed)
[facility] EMERGENCY DEPARTMENT Provider Note   CSN: [id number] Arrival date & time: 05/07/18  4656    History   Chief Complaint Chief Complaint  Patient presents with  . Eye Problem    HPI Charles Watson is a 43 y.o. male.     The history is provided by medical records and the patient. A language interpreter was used Social worker interpreter Psychologist, prison and probation services interpreters)).  Eye Problem  Associated symptoms: itching and redness   Associated symptoms: no nausea, no photophobia and no vomiting    Charles Watson is a 44 y.o. male  with a PMH as listed below who presents to the Emergency Department complaining of bilateral itchy eyes for the last 2 days.  Intermittently will water, but denies any thick discharge.  Has tried over-the-counter eyedrops with little improvement.  He states that he gets this same constellation of symptoms with itchy eyes sinus pressure this time almost every year when the pollen comes out.  No visual changes.  Eyes are not painful or hurt, just itch.  No foreign body sensation.  He denies any fever, cough, nasal congestion, sore throat, chest pain, trouble breathing.   Past Medical History:  Diagnosis Date  . Chronic back pain   . GERD (gastroesophageal reflux disease)   . Hypertension     There are no active problems to display for this patient.   History reviewed. No pertinent surgical history.      Home Medications    Prior to Admission medications   Medication Sig Start Date End Date Taking? Authorizing Provider  azelastine (OPTIVAR) 0.05 % ophthalmic solution Place 1 drop into both eyes 2 (two) times daily. 05/07/18   Ward, Chase Picket, PA-C  cetirizine (ZYRTEC ALLERGY) 10 MG tablet Take 1 tablet (10 mg total) by mouth daily. 05/07/18   Ward, Chase Picket, PA-C  ibuprofen (ADVIL,MOTRIN) 800 MG tablet Take 1 tablet (800 mg total) by mouth every 8 (eight) hours as needed for moderate pain. 01/12/18   Rennis Harding, PA-C    Family History  History reviewed. No pertinent family history.  Social History Social History   Tobacco Use  . Smoking status: Never Smoker  . Smokeless tobacco: Current User  Substance Use Topics  . Alcohol use: No  . Drug use: No     Allergies   Patient has no known allergies.   Review of Systems Review of Systems  Constitutional: Negative for chills and fever.  HENT: Positive for sinus pressure. Negative for congestion and sore throat.   Eyes: Positive for redness and itching. Negative for photophobia, pain and visual disturbance.  Respiratory: Negative for cough and shortness of breath.   Cardiovascular: Negative for chest pain.  Gastrointestinal: Negative for abdominal pain, nausea and vomiting.  Musculoskeletal: Negative for myalgias.  Skin: Negative for rash.     Physical Exam Updated Vital Signs BP (!) 143/90 (BP Location: Right Arm)   Pulse 66   Temp 97.8 F (36.6 C) (Oral)   Resp 17   Ht 5\' 5"  (1.651 m)   Wt 72.6 kg   SpO2 98%   BMI 26.63 kg/m   Physical Exam Vitals signs and nursing note reviewed.  Constitutional:      General: He is not in acute distress.    Appearance: He is well-developed.  HENT:     Head: Normocephalic and atraumatic.     Mouth/Throat:     Pharynx: Oropharynx is clear. No oropharyngeal exudate or posterior oropharyngeal erythema.  Eyes:  General: Lids are normal. Lids are everted, no foreign bodies appreciated. Vision grossly intact.        Right eye: No discharge.        Left eye: No discharge.     Extraocular Movements: Extraocular movements intact.     Conjunctiva/sclera:     Right eye: Right conjunctiva is injected.     Left eye: Left conjunctiva is injected.  Neck:     Musculoskeletal: Neck supple.  Cardiovascular:     Rate and Rhythm: Normal rate and regular rhythm.     Heart sounds: Normal heart sounds. No murmur.  Pulmonary:     Effort: Pulmonary effort is normal. No respiratory distress.     Breath sounds: Normal breath  sounds.  Abdominal:     General: There is no distension.     Palpations: Abdomen is soft.     Tenderness: There is no abdominal tenderness.  Skin:    General: Skin is warm and dry.  Neurological:     Mental Status: He is alert and oriented to person, place, and time.      ED Treatments / Results  Labs (all labs ordered are listed, but only abnormal results are displayed) Labs Reviewed - No data to display  EKG None  Radiology No results found.  Procedures Procedures (including critical care time)  Medications Ordered in ED Medications - No data to display   Initial Impression / Assessment and Plan / ED Course  I have reviewed the triage vital signs and the nursing notes.  Pertinent labs & imaging results that were available during my care of the patient were reviewed by me and considered in my medical decision making (see chart for details).       Charles Watson is a 43 y.o. male who presents to ED for itchy, watery eyes for the last few days.  Gets similar symptoms this time of year every year.  Chart reviewed from previous encounters which are consistent with patient's history.  Exam today, patient is afebrile, hemodynamically stable with 12 injection to bilateral eyes.  Do not feel this is bacterial in nature and I feel he needs antibiotics at this time.  Will treat allergic conjunctivitis with Optivar and Zyrtec. PCP or optho follow up if symptoms are not improving. Reasons to return to the emergency department were discussed and all questions answered.   Final Clinical Impressions(s) / ED Diagnoses   Final diagnoses:  Allergic conjunctivitis of both eyes    ED Discharge Orders         Ordered    cetirizine (ZYRTEC ALLERGY) 10 MG tablet  Daily,   Status:  Discontinued     05/07/18 1014    azelastine (OPTIVAR) 0.05 % ophthalmic solution  2 times daily,   Status:  Discontinued     05/07/18 1014    azelastine (OPTIVAR) 0.05 % ophthalmic solution  2 times daily      05/07/18 1015    cetirizine (ZYRTEC ALLERGY) 10 MG tablet  Daily     05/07/18 1015           Ward, Chase Picket, PA-C 05/07/18 1021    Raeford Razor, MD 05/07/18 1246
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
## 2018-05-07 NOTE — ED Triage Notes (Signed)
PT reports 2 days ago hie eye started to itch and run.

## 2018-05-10 ENCOUNTER — Encounter (HOSPITAL_COMMUNITY): Payer: Self-pay

## 2018-05-10 ENCOUNTER — Ambulatory Visit (HOSPITAL_COMMUNITY)
Admission: EM | Admit: 2018-05-10 | Discharge: 2018-05-10 | Disposition: A | Discharge status: Home / Self Care | Payer: Self-pay | Attending: Internal Medicine | Admitting: Internal Medicine

## 2018-05-10 ENCOUNTER — Other Ambulatory Visit: Payer: Self-pay

## 2018-05-10 DIAGNOSIS — R0789 Other chest pain: Secondary | ICD-10-CM

## 2018-05-10 DIAGNOSIS — R1013 Epigastric pain: Secondary | ICD-10-CM

## 2018-05-10 MED ORDER — PANTOPRAZOLE SODIUM 20 MG PO TBEC: 20.0000 mg | DELAYED_RELEASE_TABLET | Freq: Every day | ORAL | 0 refills | Status: DC

## 2018-05-10 MED ORDER — ACETAMINOPHEN 325 MG PO TABS: 650.0000 mg | ORAL_TABLET | Freq: Four times a day (QID) | ORAL | Status: DC | PRN

## 2018-05-10 NOTE — ED Provider Notes (Signed)
[facility]    CSN: [id number] Arrival date & time: 05/10/18  1718     History   Chief Complaint Chief Complaint  Patient presents with  . Chest Pain    HPI Charles Watson is a 43 y.o. male.   The history is provided by the patient. A language interpreter was used.  Chest Pain  Pain location:  Epigastric and substernal area Pain quality: aching and throbbing   Pain quality: not radiating and no tightness   Pain radiates to:  Does not radiate Pain severity:  Mild Onset quality:  Gradual Duration:  3 days Progression:  Partially resolved Chronicity:  New Context: not breathing, not eating, not lifting, not movement, not stress and not trauma   Relieved by:  None tried Worsened by:  Coughing and deep breathing Ineffective treatments:  None tried Associated symptoms: abdominal pain   Associated symptoms: no dizziness, no fever, no numbness, no shortness of breath and no vomiting     Past Medical History:  Diagnosis Date  . Chronic back pain   . GERD (gastroesophageal reflux disease)   . Hypertension     There are no active problems to display for this patient.   History reviewed. No pertinent surgical history.     Home Medications    Prior to Admission medications   Medication Sig Start Date End Date Taking? Authorizing Provider  azelastine (OPTIVAR) 0.05 % ophthalmic solution Place 1 drop into both eyes 2 (two) times daily. 05/07/18   Ward, Chase Picket, PA-C  cetirizine (ZYRTEC ALLERGY) 10 MG tablet Take 1 tablet (10 mg total) by mouth daily. 05/07/18   Ward, Chase Picket, PA-C  ibuprofen (ADVIL,MOTRIN) 800 MG tablet Take 1 tablet (800 mg total) by mouth every 8 (eight) hours as needed for moderate pain. 01/12/18   Rennis Harding, PA-C    Family History Family History  Family history unknown: Yes    Social History Social History   Tobacco Use  . Smoking status: Never Smoker  . Smokeless tobacco: Current User  Substance Use Topics  .  Alcohol use: No  . Drug use: No     Allergies   Patient has no known allergies.   Review of Systems Review of Systems  Constitutional: Negative.  Negative for fever.  HENT: Negative.   Eyes: Negative.   Respiratory: Negative for chest tightness and shortness of breath.   Cardiovascular: Positive for chest pain.  Gastrointestinal: Positive for abdominal pain. Negative for vomiting.  Genitourinary: Negative.   Musculoskeletal: Negative.   Skin: Negative.   Neurological: Negative for dizziness and numbness.     Physical Exam Triage Vital Signs ED Triage Vitals [05/10/18 1754]  Enc Vitals Group     BP (!) 142/93     Pulse      Resp      Temp 98.6 F (37 C)     Temp src      SpO2 100 %     Weight      Height      Head Circumference      Peak Flow      Pain Score 8     Pain Loc      Pain Edu?      Excl. in GC?    No data found.  Updated Vital Signs BP (!) 142/93 (BP Location: Left Arm)   Temp 98.6 F (37 C)   SpO2 100%   Visual Acuity Right Eye Distance:   Left Eye Distance:  Bilateral Distance:    Right Eye Near:   Left Eye Near:    Bilateral Near:     Physical Exam Constitutional:      Appearance: He is well-developed.  Cardiovascular:     Rate and Rhythm: Normal rate and regular rhythm.     Heart sounds: Normal heart sounds. No friction rub. No gallop. No S3 sounds.   Pulmonary:     Effort: Pulmonary effort is normal.     Breath sounds: Normal breath sounds. No decreased breath sounds, wheezing or rhonchi.  Chest:     Chest wall: Tenderness present.  Abdominal:     General: Bowel sounds are normal.     Palpations: Abdomen is soft.  Musculoskeletal: Normal range of motion.  Skin:    General: Skin is warm.     Capillary Refill: Capillary refill takes less than 2 seconds.  Neurological:     General: No focal deficit present.     Mental Status: He is alert.      UC Treatments / Results  Labs (all labs ordered are listed, but only  abnormal results are displayed) Labs Reviewed - No data to display  EKG None  Radiology No results found.  Procedures Procedures (including critical care time)  Medications Ordered in UC Medications - No data to display  Initial Impression / Assessment and Plan / UC Course  I have reviewed the triage vital signs and the nursing notes.  Pertinent labs & imaging results that were available during my care of the patient were reviewed by me and considered in my medical decision making (see chart for details).    1.  Chest wall pain: EKG shows normal sinus rhythm Tylenol as needed for pain  2.  Epigastric abdominal pain: Protonix 20 mg orally daily If no improvement in 1 month patient will require GI evaluation with possible EGD  Final Clinical Impressions(s) / UC Diagnoses   Final diagnoses:  None   Discharge Instructions   None    ED Prescriptions    None     Controlled Substance Prescriptions [place] Controlled Substance Registry consulted? No   Merrilee Jansky, MD 05/10/18 [phone]

## 2018-05-10 NOTE — ED Triage Notes (Signed)
C/o of abdominal pain and chest pain, no reports of nausea or vomiting

## 2018-05-22 ENCOUNTER — Ambulatory Visit (INDEPENDENT_AMBULATORY_CARE_PROVIDER_SITE_OTHER): Payer: Self-pay

## 2018-05-22 ENCOUNTER — Encounter (HOSPITAL_COMMUNITY): Payer: Self-pay | Admitting: Emergency Medicine

## 2018-05-22 ENCOUNTER — Ambulatory Visit (HOSPITAL_COMMUNITY)
Admission: EM | Admit: 2018-05-22 | Discharge: 2018-05-22 | Disposition: A | Discharge status: Home / Self Care | Payer: Self-pay | Attending: Family Medicine | Admitting: Family Medicine

## 2018-05-22 DIAGNOSIS — W07XXXA Fall from chair, initial encounter: Secondary | ICD-10-CM

## 2018-05-22 DIAGNOSIS — S299XXA Unspecified injury of thorax, initial encounter: Secondary | ICD-10-CM

## 2018-05-22 DIAGNOSIS — Y9269 Other specified industrial and construction area as the place of occurrence of the external cause: Secondary | ICD-10-CM

## 2018-05-22 DIAGNOSIS — M546 Pain in thoracic spine: Secondary | ICD-10-CM

## 2018-05-22 MED ORDER — IBUPROFEN 800 MG PO TABS: 800.0000 mg | ORAL_TABLET | Freq: Three times a day (TID) | ORAL | 0 refills | Status: AC

## 2018-05-22 MED ORDER — ACETAMINOPHEN 325 MG PO TABS: 650.0000 mg | ORAL_TABLET | Freq: Four times a day (QID) | ORAL | Status: DC | PRN

## 2018-05-22 NOTE — ED Provider Notes (Signed)
[facility]    CSN: [id number] Arrival date & time: 05/22/18  1237     History   Chief Complaint Chief Complaint  Patient presents with  . Back Pain    Nepali Interpreter    HPI Charles Watson is a 43 y.o. male.   Patient is a 43 year old male the presents today with mid back pain.  This started yesterday after he fell at work landing directly on his back.  He fell on a hard floor.  Reports that he fell out of a chair.  Since he has been having constant back pain.  He has not take anything for the pain.  The pain is worse with walking and certain movements.  He denies any associated numbness, tingling, weakness in extremity's, saddle paresthesias, loss of bowel or bladder function.  ROS per HPI      Past Medical History:  Diagnosis Date  . Chronic back pain   . GERD (gastroesophageal reflux disease)   . Hypertension     There are no active problems to display for this patient.   History reviewed. No pertinent surgical history.     Home Medications    Prior to Admission medications   Medication Sig Start Date End Date Taking? Authorizing Provider  acetaminophen (TYLENOL) 325 MG tablet Take 2 tablets (650 mg total) by mouth every 6 (six) hours as needed. 05/22/18   Dahlia Byes A, NP  azelastine (OPTIVAR) 0.05 % ophthalmic solution Place 1 drop into both eyes 2 (two) times daily. 05/07/18   Ward, Chase Picket, PA-C  cetirizine (ZYRTEC ALLERGY) 10 MG tablet Take 1 tablet (10 mg total) by mouth daily. 05/07/18   Ward, Chase Picket, PA-C  ibuprofen (ADVIL,MOTRIN) 800 MG tablet Take 1 tablet (800 mg total) by mouth every 8 (eight) hours as needed for moderate pain. 01/12/18   Wurst, [place], PA-C  ibuprofen (ADVIL,MOTRIN) 800 MG tablet Take 1 tablet (800 mg total) by mouth 3 (three) times daily. 05/22/18   Arelys Glassco, Gloris Manchester A, NP  pantoprazole (PROTONIX) 20 MG tablet Take 1 tablet (20 mg total) by mouth daily. 05/10/18   Lamptey, Britta Mccreedy, MD    Family History Family  History  Family history unknown: Yes    Social History Social History   Tobacco Use  . Smoking status: Never Smoker  . Smokeless tobacco: Current User  Substance Use Topics  . Alcohol use: No  . Drug use: No     Allergies   Patient has no known allergies.   Review of Systems Review of Systems   Physical Exam Triage Vital Signs ED Triage Vitals [05/22/18 1320]  Enc Vitals Group     BP 131/78     Pulse Rate 75     Resp 16     Temp 97.8 F (36.6 C)     Temp src      SpO2 98 %     Weight      Height      Head Circumference      Peak Flow      Pain Score      Pain Loc      Pain Edu?      Excl. in GC?    No data found.  Updated Vital Signs BP 131/78   Pulse 75   Temp 97.8 F (36.6 C)   Resp 16   SpO2 98%   Visual Acuity Right Eye Distance:   Left Eye Distance:   Bilateral Distance:    Right  Eye Near:   Left Eye Near:    Bilateral Near:     Physical Exam Vitals signs and nursing note reviewed.  Constitutional:      Appearance: Normal appearance.  HENT:     Head: Normocephalic and atraumatic.     Nose: Nose normal.  Eyes:     Conjunctiva/sclera: Conjunctivae normal.  Neck:     Musculoskeletal: Normal range of motion and neck supple. No muscular tenderness.  Pulmonary:     Effort: Pulmonary effort is normal.  Musculoskeletal: Normal range of motion.        General: Tenderness and signs of injury present. No swelling.       Arms:     Comments: Tender to the mid thoracic area without bruising, swelling, deformities or erythema. Good ROM.   Skin:    General: Skin is warm and dry.  Neurological:     Mental Status: He is alert.  Psychiatric:        Mood and Affect: Mood normal.      UC Treatments / Results  Labs (all labs ordered are listed, but only abnormal results are displayed) Labs Reviewed - No data to display  EKG None  Radiology Dg Thoracic Spine 2 View  Result Date: 05/22/2018 CLINICAL DATA:  Fall yesterday with upper  back pain. EXAM: THORACIC SPINE 2 VIEWS COMPARISON:  Chest x-ray 06/24/2014 FINDINGS: There is no evidence of thoracic spine fracture. Alignment is normal. No other significant bone abnormalities are identified. IMPRESSION: Negative. Electronically Signed   By: Elberta Fortisaniel  Boyle M.D.   On: 05/22/2018 15:02    Procedures Procedures (including critical care time)  Medications Ordered in UC Medications - No data to display  Initial Impression / Assessment and Plan / UC Course  I have reviewed the triage vital signs and the nursing notes.  Pertinent labs & imaging results that were available during my care of the patient were reviewed by me and considered in my medical decision making (see chart for details).     X-ray negative for any abnormalities Most bruised muscle  Tylenol or ibuprofen as needed for pain Work note given as requested Follow up as needed for continued or worsening symptoms  Final Clinical Impressions(s) / UC Diagnoses   Final diagnoses:  Acute midline thoracic back pain     Discharge Instructions     Your x-ray was negative for any fractures or abnormalities You can take Tylenol or ibuprofen for pain as needed Work note given Follow up as needed for continued or worsening symptoms     ED Prescriptions    Medication Sig Dispense Auth. Provider   acetaminophen (TYLENOL) 325 MG tablet Take 2 tablets (650 mg total) by mouth every 6 (six) hours as needed.  Kristl Morioka A, NP   ibuprofen (ADVIL,MOTRIN) 800 MG tablet Take 1 tablet (800 mg total) by mouth 3 (three) times daily. 21 tablet Dahlia ByesBast, Caleyah Jr A, NP     Controlled Substance Prescriptions [place] Controlled Substance Registry consulted? Not Applicable   Janace ArisBast, Audrick Lamoureaux A, NP 05/22/18 [id number]

## 2018-05-22 NOTE — Discharge Instructions (Signed)
Your x-ray was negative for any fractures or abnormalities You can take Tylenol or ibuprofen for pain as needed Work note given Follow up as needed for continued or worsening symptoms

## 2018-05-22 NOTE — ED Triage Notes (Signed)
Pt states he fell at work yesterday and hurt his back.

## 2018-06-03 ENCOUNTER — Other Ambulatory Visit: Payer: Self-pay

## 2018-06-03 ENCOUNTER — Encounter (HOSPITAL_COMMUNITY): Payer: Self-pay | Admitting: Emergency Medicine

## 2018-06-03 ENCOUNTER — Emergency Department (HOSPITAL_COMMUNITY)
Admission: EM | Admit: 2018-06-03 | Discharge: 2018-06-03 | Disposition: A | Discharge status: Home / Self Care | Payer: Self-pay | Attending: Emergency Medicine | Admitting: Emergency Medicine

## 2018-06-03 ENCOUNTER — Emergency Department (HOSPITAL_COMMUNITY): Payer: Self-pay

## 2018-06-03 DIAGNOSIS — I10 Essential (primary) hypertension: Secondary | ICD-10-CM | POA: Insufficient documentation

## 2018-06-03 DIAGNOSIS — Z79899 Other long term (current) drug therapy: Secondary | ICD-10-CM | POA: Insufficient documentation

## 2018-06-03 DIAGNOSIS — R0789 Other chest pain: Secondary | ICD-10-CM

## 2018-06-03 DIAGNOSIS — R1013 Epigastric pain: Secondary | ICD-10-CM

## 2018-06-03 DIAGNOSIS — F17220 Nicotine dependence, chewing tobacco, uncomplicated: Secondary | ICD-10-CM | POA: Insufficient documentation

## 2018-06-03 DIAGNOSIS — R739 Hyperglycemia, unspecified: Secondary | ICD-10-CM | POA: Insufficient documentation

## 2018-06-03 DIAGNOSIS — K219 Gastro-esophageal reflux disease without esophagitis: Secondary | ICD-10-CM | POA: Insufficient documentation

## 2018-06-03 LAB — COMPREHENSIVE METABOLIC PANEL
ALT: 43 U/L (ref 0–44)
AST: 29 U/L (ref 15–41)
Albumin: 4 g/dL (ref 3.5–5.0)
Alkaline Phosphatase: 72 U/L (ref 38–126)
Anion gap: 9 (ref 5–15)
BUN: 10 mg/dL (ref 6–20)
CO2: 19 mmol/L — ABNORMAL LOW (ref 22–32)
Calcium: 9 mg/dL (ref 8.9–10.3)
Chloride: 108 mmol/L (ref 98–111)
Creatinine, Ser: 1.18 mg/dL (ref 0.61–1.24)
GFR calc Af Amer: >60 mL/min (ref >60)
GFR calc non Af Amer: >60 mL/min (ref >60)
Glucose, Bld: 278 mg/dL — ABNORMAL HIGH (ref 70–99)
Potassium: 3.6 mmol/L (ref 3.5–5.1)
Sodium: 136 mmol/L (ref 135–145)
Total Bilirubin: 1.2 mg/dL (ref 0.3–1.2)
Total Protein: 7 g/dL (ref 6.5–8.1)

## 2018-06-03 LAB — CBC WITH DIFFERENTIAL/PLATELET
Abs Immature Granulocytes: 0.02 10*3/uL (ref 0.00–0.07)
Basophils Absolute: 0 10*3/uL (ref 0.0–0.1)
Basophils Relative: 1 %
Eosinophils Absolute: 0.5 10*3/uL (ref 0.0–0.5)
Eosinophils Relative: 6 %
HCT: 47.6 % (ref 39.0–52.0)
Hemoglobin: 16.3 g/dL (ref 13.0–17.0)
Immature Granulocytes: 0 %
Lymphocytes Relative: 42 %
Lymphs Abs: 3.4 10*3/uL (ref 0.7–4.0)
MCH: 28.5 pg (ref 26.0–34.0)
MCHC: 34.2 g/dL (ref 30.0–36.0)
MCV: 83.4 fL (ref 80.0–100.0)
Monocytes Absolute: 0.7 10*3/uL (ref 0.1–1.0)
Monocytes Relative: 8 %
Neutro Abs: 3.5 10*3/uL (ref 1.7–7.7)
Neutrophils Relative %: 43 %
Platelets: 196 10*3/uL (ref 150–400)
RBC: 5.71 MIL/uL (ref 4.22–5.81)
RDW: 11.9 % (ref 11.5–15.5)
WBC: 8.1 10*3/uL (ref 4.0–10.5)
nRBC: 0 % (ref 0.0–0.2)

## 2018-06-03 LAB — LIPASE, BLOOD: Lipase: 44 U/L (ref 11–51)

## 2018-06-03 LAB — TROPONIN I: Troponin I: <0.03 ng/mL (ref <0.03)

## 2018-06-03 MED ORDER — LIDOCAINE VISCOUS HCL 2 % MT SOLN
15.0000 mL | Freq: Once | OROMUCOSAL | Status: AC
  Administered 2018-06-03: 15 mL via ORAL
  Filled 2018-06-03: qty 15

## 2018-06-03 MED ORDER — ALUM & MAG HYDROXIDE-SIMETH 200-200-20 MG/5ML PO SUSP
30.0000 mL | Freq: Once | ORAL | Status: AC
  Administered 2018-06-03: 30 mL via ORAL
  Filled 2018-06-03: qty 30

## 2018-06-03 MED ORDER — SUCRALFATE 1 G PO TABS: 1.0000 g | ORAL_TABLET | Freq: Three times a day (TID) | ORAL | 0 refills | Status: DC

## 2018-06-03 MED ORDER — FAMOTIDINE 40 MG PO TABS: 40.0000 mg | ORAL_TABLET | Freq: Every day | ORAL | 0 refills | Status: DC

## 2018-06-03 NOTE — ED Provider Notes (Signed)
[facility] EMERGENCY DEPARTMENT Provider Note   CSN: [id number] Arrival date & time: 06/03/18  1951  History   Chief Complaint Chief Complaint  Patient presents with  . Chest Pain   HPI Charles Watson is a 43 y.o. male with past medical history significant for hypertension, GERD, chronic back pain who presents for evaluation of abdominal pain and chest pain.  Patient states he has had this pain over the last 2 weeks.  Describes sensation as burning in his abdomen into his chest.  Pain worse with eating especially after he lays down after he eats.  Rates his pain a 7/10.  Pain does not radiate.  His chest pain is not exertional and is not pleuritic.  He has no associated radiation of pain, lightheadedness, dizziness, nausea, diaphoresis.  Did have one episode of emesis 2 days ago.  Emesis nonbloody, nonbilious.  Pain is located to his epigastric region.  He has not taken anything for his symptoms.  Was seen at urgent care for similar symptoms was prescribed Pepcid and referred to GI.  He has not filled this prescription and has not followed up with GI.  Denies alcohol use, NSAID use, history of pancreatitis, history of H. pylori.  Denies fever, chills, neck pain, neck stiffness, nausea, hemoptysis, hematemesis, cough, shortness of breath, melena, hematochezia, dysuria, constipation or diarrhea.  Normal bowel movements, last 2 days ago. No hx of esophageal varices.  Tolerating PO intake at home without difficulty.  Prior abdominal surgeries- None  History obtained from patient.  Nepali interpreter used, however continued language barrier.    HPI  Past Medical History:  Diagnosis Date  . Chronic back pain   . GERD (gastroesophageal reflux disease)   . Hypertension     There are no active problems to display for this patient.   History reviewed. No pertinent surgical history.      Home Medications    Prior to Admission medications   Medication Sig Start Date End  Date Taking? Authorizing Provider  acetaminophen (TYLENOL) 325 MG tablet Take 2 tablets (650 mg total) by mouth every 6 (six) hours as needed. 05/22/18   Dahlia Byes A, NP  azelastine (OPTIVAR) 0.05 % ophthalmic solution Place 1 drop into both eyes 2 (two) times daily. 05/07/18   Ward, Chase Picket, PA-C  cetirizine (ZYRTEC ALLERGY) 10 MG tablet Take 1 tablet (10 mg total) by mouth daily. 05/07/18   Ward, Chase Picket, PA-C  famotidine (PEPCID) 40 MG tablet Take 1 tablet (40 mg total) by mouth daily. 06/03/18   Rowyn Mustapha A, PA-C  ibuprofen (ADVIL,MOTRIN) 800 MG tablet Take 1 tablet (800 mg total) by mouth every 8 (eight) hours as needed for moderate pain. 01/12/18   Wurst, [place], PA-C  ibuprofen (ADVIL,MOTRIN) 800 MG tablet Take 1 tablet (800 mg total) by mouth 3 (three) times daily. 05/22/18   Bast, Gloris Manchester A, NP  pantoprazole (PROTONIX) 20 MG tablet Take 1 tablet (20 mg total) by mouth daily. 05/10/18   Lamptey, Britta Mccreedy, MD  sucralfate (CARAFATE) 1 g tablet Take 1 tablet (1 g total) by mouth 4 (four) times daily -  with meals and at bedtime for 10 days. 06/03/18 06/13/18  Alyx Gee A, PA-C    Family History Family History  Family history unknown: Yes    Social History Social History   Tobacco Use  . Smoking status: Never Smoker  . Smokeless tobacco: Current User  Substance Use Topics  . Alcohol use: No  . Drug  use: No     Allergies   Patient has no known allergies.   Review of Systems Review of Systems  HENT: Negative.   Respiratory: Negative.   Cardiovascular: Positive for chest pain. Negative for palpitations and leg swelling.  Gastrointestinal: Positive for abdominal pain and vomiting. Negative for abdominal distention, anal bleeding, blood in stool, constipation, diarrhea, nausea and rectal pain.  Genitourinary: Negative.   Musculoskeletal: Negative.   Skin: Negative.   Neurological: Negative.   All other systems reviewed and are negative.    Physical Exam  Updated Vital Signs BP 118/87   Pulse (!) 55   Temp 98.6 F (37 C) (Oral)   Resp 19   Ht  (1.651 m)   Wt 72.5 kg   SpO2 98%   BMI 26.60 kg/m   Physical Exam Vitals signs and nursing note reviewed.  Constitutional:      General: He is not in acute distress.    Appearance: He is well-developed. He is not ill-appearing, toxic-appearing or diaphoretic.  HENT:     Head: Normocephalic and atraumatic.  Eyes:     Pupils: Pupils are equal, round, and reactive to light.  Neck:     Musculoskeletal: Normal range of motion and neck supple.     Comments: No neck stiffness or neck rigidity.  No meningismus.  No JVD.  No carotid bruits. Cardiovascular:     Rate and Rhythm: Normal rate and regular rhythm.     Heart sounds: Normal heart sounds.     Comments: No murmurs, rubs or gallops. Pulmonary:     Effort: Pulmonary effort is normal. No respiratory distress.     Comments: Clear to auscultation bilateral without wheeze, rhonchi rales.  No accessory muscle usage.  Speaks in full sentences without difficulty. Chest:     Comments: Mild chest wall TTP to inferior sternal area.  He has no swelling, deformity, crepitus or edema.  No overlying skin changes. Abdominal:     General: There is no distension.     Palpations: Abdomen is soft.     Comments: Mild epigastric tenderness palpation. He has negative Murphy sign.  No rebound or guarding.  No CVA tenderness.  No overlying skin changes to abdominal wall.  Normoactive bowel sounds.  Musculoskeletal: Normal range of motion.     Comments: Moves all 4 extremities without difficulty.  Calves nontender bilaterally without edema, erythema or warmth.  Feet:     Comments: 2+ DP, PT pulses bilaterally Skin:    General: Skin is warm and dry.     Comments: Brisk capillary refill.  No rashes or lesions.  Neurological:     Mental Status: He is alert.     Comments: Nerves II through XII grossly intact.  No facial droop.  No dysphasia.  Ambulatory in  department that difficulty.    ED Treatments / Results  Labs (all labs ordered are listed, but only abnormal results are displayed) Labs Reviewed  COMPREHENSIVE METABOLIC PANEL - Abnormal; Notable for the following components:      Result Value   CO2 19 (*)    Glucose, Bld 278 (*)    All other components within normal limits  CBC WITH DIFFERENTIAL/PLATELET  LIPASE, BLOOD  TROPONIN I  URINALYSIS, ROUTINE W REFLEX MICROSCOPIC    EKG EKG Interpretation  Date/Time:  Sunday June 03 2018 20:11:01 EDT Ventricular Rate:  65 PR Interval:    QRS Duration: 82 QT Interval:  378 QTC Calculation: 393 R Axis:   83  Text Interpretation:  Sinus rhythm Nonspecific ST abnormality No significant change since last tracing Confirmed by [name] ([id number]) on 06/03/2018 8:26:53 PM   Radiology Dg Chest 2 View  Result Date: 06/03/2018 CLINICAL DATA:  Initial evaluation for acute chest pain. EXAM: CHEST - 2 VIEW COMPARISON:  Prior radiograph from 06/24/2014. FINDINGS: Cardiac and mediastinal silhouettes are stable in size and contour, and remain within normal limits. Lungs mildly hypoinflated. Associated mild bibasilar subsegmental atelectasis. No focal infiltrates. No edema or effusion. No pneumothorax. No acute osseous finding. IMPRESSION: 1. Shallow lung inflation with associated mild bibasilar subsegmental atelectasis. 2. No other active cardiopulmonary disease. Electronically Signed   By: Rise Mu M.D.   On: 06/03/2018 20:52    Procedures Procedures (including critical care time)  Medications Ordered in ED Medications  alum & mag hydroxide-simeth (MAALOX/MYLANTA) 200-200-20 MG/5ML suspension 30 mL (30 mLs Oral Given 06/03/18 2052)    And  lidocaine (XYLOCAINE) 2 % viscous mouth solution 15 mL (15 mLs Oral Given 06/03/18 2052)     Initial Impression / Assessment and Plan / ED Course  I have reviewed the triage vital signs and the nursing notes.  Pertinent labs & imaging  results that were available during my care of the patient were reviewed by me and considered in my medical decision making (see chart for details).  43 year old male appears otherwise well presents for evaluation of chest pain abdominal pain.  Afebrile, nonseptic, non-ill-appearing.  Described as a burning sensation, worse with food intake especially after he lays down after he eats.  He has mild epigastric tenderness palpation with negative Murphy sign.  No overlying skin changes to abdominal wall.  Normoactive bowel sounds. Non surgical abdomen. No prior abdominal surgeries.  Chest wall TTP to inferior sternal area.  He has no overlying skin changes to his chest wall.  Chest pain not exertional in nature.  No associated lightheadedness, dizziness, diaphoresis.  Heart score- 1- risk factors,PERC negative, Wells criteria low risk.  He has no risk factors for DVT.  Mucous membranes moist.  Lungs clear.  Heart without murmurs, rubs or gallops.  No tachycardia, tachypnea or hypoxia.  Symptoms likely related to reflux. Low suspicion for gallbladder pathology.  Was seen 1 month ago was prescribed Protonix however has not started this.  Will give GI cocktail, basic labs, EKG, imaging and reevaluate.  On reevaluation abdomen soft, nontender without rebound or guarding.  No surgical abdomen.  Patient states his pain resolved with GI cocktail.  Likely reflux as cause of his pain.  I personally reviewed imaging and labs.  CBC without leukocytosis, hemoglobin 16.3, Metabolic panel with hyperglycemia at 278.  He does not have history of diabetes.  He has no anion gap to suggest DKA.  Discussed with patient to follow-up with PCP for his elevated blood sugar.  Lipase 44, troponin negative, EKG without ischemic changes.  X-ray without evidence of infiltrates, cardiomegaly, pulmonary edema or pneumothorax.  Patient has had symptoms x2 weeks do not feel patient needs delta troponin at this time.  His symptoms are nonexertional  in nature.  Patient is nontoxic, nonseptic appearing, in no apparent distress.  Patient's pain and other symptoms adequately managed in emergency department.  Fluid bolus given.  Labs, imaging and vitals reviewed.  Patient does not meet the SIRS or Sepsis criteria.  On repeat exam patient does not have a surgical abdomin and there are no peritoneal signs.  No indication of appendicitis, bowel obstruction, bowel perforation, cholecystitis, diverticulitis. Chest pain is  not likely of cardiac or pulmonary etiology d/t presentation, PERC negative, VSS, no tracheal deviation, no JVD or new murmur, RRR, breath sounds equal bilaterally, EKG without acute abnormalities, negative troponin, and negative CXR. Pt has been advised to return to the ED if CP becomes exertional, associated with diaphoresis or nausea, radiates to left jaw/arm, worsens or becomes concerning in any way. Pt appears reliable for follow up and is agreeable to discharge.  Patient is hemodynamically stable and in no acute distress.  Patient able to ambulate in department prior to ED and tolerating PO intake without difficulty.  Evaluation does not show acute pathology that would require ongoing or additional emergent interventions while in the emergency department or further inpatient treatment.  I have discussed the diagnosis with the patient and answered all questions.  Pain is been managed while in the emergency department and patient has no further complaints prior to discharge.  Patient is comfortable with plan discussed in room and is stable for discharge at this time.  I have discussed strict return precautions for returning to the emergency department.  Patient was encouraged to follow-up with PCP/specialist refer to at discharge.      Final Clinical Impressions(s) / ED Diagnoses   Final diagnoses:  Epigastric pain  Gastroesophageal reflux disease, esophagitis presence not specified  Atypical chest pain  Hyperglycemia    ED  Discharge Orders         Ordered    famotidine (PEPCID) 40 MG tablet  Daily     06/03/18 2205    sucralfate (CARAFATE) 1 g tablet  3 times daily with meals & bedtime     06/03/18 2205           Annebelle Bostic A, PA-C 06/03/18 2355    Cathren LaineSteinl, Kevin, MD 06/04/18 1528

## 2018-06-03 NOTE — ED Triage Notes (Signed)
Reports abdominal pain and chest burning for the last 15 days.  Also reports vomiting.  Denies drinking any alcohol.  Reports pain worse with eating.  Denies any cough or fever.

## 2018-06-03 NOTE — Discharge Instructions (Signed)
Follow up with PCP for reevaluation.  

## 2018-06-03 NOTE — ED Notes (Signed)
Discharge explained to patient using translator.  Verbalized understanding.

## 2018-07-20 ENCOUNTER — Ambulatory Visit (HOSPITAL_COMMUNITY)
Admission: EM | Admit: 2018-07-20 | Discharge: 2018-07-20 | Disposition: A | Discharge status: Home / Self Care | Payer: Medicaid Other | Attending: Family Medicine | Admitting: Family Medicine

## 2018-07-20 ENCOUNTER — Encounter (HOSPITAL_COMMUNITY): Payer: Self-pay | Admitting: Emergency Medicine

## 2018-07-20 DIAGNOSIS — L739 Follicular disorder, unspecified: Secondary | ICD-10-CM

## 2018-07-20 MED ORDER — CETIRIZINE HCL 10 MG PO TABS: 10.0000 mg | ORAL_TABLET | Freq: Every day | ORAL | 0 refills | Status: AC

## 2018-07-20 MED ORDER — DOXYCYCLINE HYCLATE 100 MG PO CAPS: 100.0000 mg | ORAL_CAPSULE | Freq: Two times a day (BID) | ORAL | 0 refills | Status: DC

## 2018-07-20 NOTE — ED Triage Notes (Signed)
With nepali interperter, pt states "I do have some rash on my neck and on my back" rash for x1 week.

## 2018-07-20 NOTE — Discharge Instructions (Addendum)
Take the medication as prescribed °Follow up as needed for continued or worsening symptoms ° °

## 2018-07-20 NOTE — ED Provider Notes (Signed)
[facility]    CSN: [id number] Arrival date & time: 07/20/18  [id number]     History   Chief Complaint Chief Complaint  Patient presents with  . Rash    HPI Charles Watson is a 43 y.o. male.   Patient is a 43 year old male with a past medical history of chronic back pain, GERD, hypertension.  He presents today with approximately 1 week of worsening rash to back of neck, hairline and upper back area.  Describes rash is somewhat painful at times.  And itchy.  He has not done anything to treat the rash.  Denies any history of same.  Patient reporting got haircut approximately 2 weeks ago or less.   Denies any fever, joint pain. Denies any recent changes in lotions, detergents, foods or other possible irritants. No recent travel. Nobody else at home has the rash. Patient has been outside but denies any contact with plants or insects. No new foods or medications.    ROS per HPI      Past Medical History:  Diagnosis Date  . Chronic back pain   . GERD (gastroesophageal reflux disease)   . Hypertension     There are no active problems to display for this patient.   History reviewed. No pertinent surgical history.     Home Medications    Prior to Admission medications   Medication Sig Start Date End Date Taking? Authorizing Provider  acetaminophen (TYLENOL) 325 MG tablet Take 2 tablets (650 mg total) by mouth every 6 (six) hours as needed. 05/22/18   Janace ArisBast, Lukasz Rogus A, NP  cetirizine (ZYRTEC ALLERGY) 10 MG tablet Take 1 tablet (10 mg total) by mouth daily. 07/20/18   Dahlia ByesBast, Amanee Iacovelli A, NP  doxycycline (VIBRAMYCIN) 100 MG capsule Take 1 capsule (100 mg total) by mouth 2 (two) times daily. 07/20/18   Dahlia ByesBast, Paradise Vensel A, NP  ibuprofen (ADVIL,MOTRIN) 800 MG tablet Take 1 tablet (800 mg total) by mouth every 8 (eight) hours as needed for moderate pain. 01/12/18   Wurst, [place], PA-C  ibuprofen (ADVIL,MOTRIN) 800 MG tablet Take 1 tablet (800 mg total) by mouth 3 (three) times daily. 05/22/18    Dahlia ByesBast, Jolette Lana A, NP  famotidine (PEPCID) 40 MG tablet Take 1 tablet (40 mg total) by mouth daily. 06/03/18 07/20/18  Henderly, Britni A, PA-C  pantoprazole (PROTONIX) 20 MG tablet Take 1 tablet (20 mg total) by mouth daily. 05/10/18 07/20/18  Lamptey, Britta MccreedyPhilip O, MD  sucralfate (CARAFATE) 1 g tablet Take 1 tablet (1 g total) by mouth 4 (four) times daily -  with meals and at bedtime for 10 days. 06/03/18 07/20/18  Henderly, Britni A, PA-C    Family History Family History  Family history unknown: Yes    Social History Social History   Tobacco Use  . Smoking status: Never Smoker  . Smokeless tobacco: Current User  Substance Use Topics  . Alcohol use: No  . Drug use: No     Allergies   Patient has no known allergies.   Review of Systems Review of Systems   Physical Exam Triage Vital Signs ED Triage Vitals [07/20/18 0858]  Enc Vitals Group     BP 125/86     Pulse Rate (!) 57     Resp 16     Temp 98.1 F (36.7 C)     Temp Source Oral     SpO2 95 %     Weight      Height      Head Circumference  Peak Flow      Pain Score      Pain Loc      Pain Edu?      Excl. in [place]?    No data found.  Updated Vital Signs BP 125/86 (BP Location: Left Arm)   Pulse (!) 57 Comment: RN notified. SP   Temp 98.1 F (36.7 C) (Oral)   Resp 16   SpO2 95%   Visual Acuity Right Eye Distance:   Left Eye Distance:   Bilateral Distance:    Right Eye Near:   Left Eye Near:    Bilateral Near:     Physical Exam Vitals signs and nursing note reviewed.  Constitutional:      General: He is not in acute distress.    Appearance: Normal appearance. He is not ill-appearing, toxic-appearing or diaphoretic.  HENT:     Head: Normocephalic and atraumatic.     Nose: Nose normal.     Mouth/Throat:     Pharynx: Oropharynx is clear.  Eyes:     Conjunctiva/sclera: Conjunctivae normal.  Pulmonary:     Effort: Pulmonary effort is normal.  Musculoskeletal: Normal range of motion.  Skin:     General: Skin is warm and dry.     Findings: Rash present.     Comments: Scattered pustules to posterior neck and hairline. Similar rash to upper back area with some hyperpigmentation.  Neurological:     Mental Status: He is alert.  Psychiatric:        Mood and Affect: Mood normal.      UC Treatments / Results  Labs (all labs ordered are listed, but only abnormal results are displayed) Labs Reviewed - No data to display  EKG None  Radiology No results found.  Procedures Procedures (including critical care time)  Medications Ordered in UC Medications - No data to display  Initial Impression / Assessment and Plan / UC Course  I have reviewed the triage vital signs and the nursing notes.  Pertinent labs & imaging results that were available during my care of the patient were reviewed by me and considered in my medical decision making (see chart for details).     Rash consistent with folliculitis Treating with doxycycline. Zyrtec for itching Follow up as needed for continued or worsening symptoms   Final Clinical Impressions(s) / UC Diagnoses   Final diagnoses:  Folliculitis     Discharge Instructions     Take the medication as prescribed Follow up as needed for continued or worsening symptoms     ED Prescriptions    Medication Sig Dispense Auth. Provider   cetirizine (ZYRTEC ALLERGY) 10 MG tablet Take 1 tablet (10 mg total) by mouth daily. 30 tablet Chelsy Parrales A, NP   doxycycline (VIBRAMYCIN) 100 MG capsule Take 1 capsule (100 mg total) by mouth 2 (two) times daily. 20 capsule Loura Halt A, NP     Controlled Substance Prescriptions [place] Controlled Substance Registry consulted? Not Applicable   Orvan July, NP 07/20/18 [id number]

## 2018-09-26 ENCOUNTER — Emergency Department (HOSPITAL_COMMUNITY)
Admission: EM | Admit: 2018-09-26 | Discharge: 2018-09-26 | Disposition: A | Discharge status: Home / Self Care | Payer: Self-pay | Attending: Emergency Medicine | Admitting: Emergency Medicine

## 2018-09-26 ENCOUNTER — Emergency Department (HOSPITAL_COMMUNITY): Payer: Self-pay

## 2018-09-26 ENCOUNTER — Other Ambulatory Visit: Payer: Self-pay

## 2018-09-26 ENCOUNTER — Encounter (HOSPITAL_COMMUNITY): Payer: Self-pay | Admitting: Emergency Medicine

## 2018-09-26 DIAGNOSIS — M79605 Pain in left leg: Secondary | ICD-10-CM | POA: Insufficient documentation

## 2018-09-26 DIAGNOSIS — L989 Disorder of the skin and subcutaneous tissue, unspecified: Secondary | ICD-10-CM | POA: Insufficient documentation

## 2018-09-26 DIAGNOSIS — Z79899 Other long term (current) drug therapy: Secondary | ICD-10-CM | POA: Insufficient documentation

## 2018-09-26 DIAGNOSIS — I10 Essential (primary) hypertension: Secondary | ICD-10-CM | POA: Insufficient documentation

## 2018-09-26 DIAGNOSIS — M79604 Pain in right leg: Secondary | ICD-10-CM | POA: Insufficient documentation

## 2018-09-26 MED ORDER — CEPHALEXIN 500 MG PO CAPS: 500.0000 mg | ORAL_CAPSULE | Freq: Four times a day (QID) | ORAL | 0 refills | Status: AC

## 2018-09-26 MED ORDER — NAPROXEN 500 MG PO TABS: 500.0000 mg | ORAL_TABLET | Freq: Two times a day (BID) | ORAL | 0 refills | Status: AC

## 2018-09-26 NOTE — ED Notes (Signed)
ED Provider at bedside. 

## 2018-09-26 NOTE — ED Notes (Signed)
Pt is ambulatory to restroom with a steady gait.  

## 2018-09-26 NOTE — ED Triage Notes (Signed)
Pt here from home with c/o left upper leg  pain from what looks like a skin tag ?

## 2018-09-26 NOTE — ED Notes (Signed)
Patient transported to X-ray 

## 2018-09-26 NOTE — ED Provider Notes (Signed)
[facility] Provider Note   CSN: [id number] Arrival date & time: 09/26/18  0844    History   Chief Complaint Chief Complaint  Patient presents with   Leg Pain   Nepali translator was used throughout this evaluation.   HPI Charles Watson is a 43 y.o. male.     HPI   Pt is a 43 y/o male with a h/o chronic back pain, GERD, HTN, who presents to the ED today for eval of a skin abnormality to the left thigh and bilat leg pain that began yesterday. He is also c/o a "lump in my left thigh" that he noticed a week ago. The lump is painful when he sits and he thinks that may be causing is bilat leg pain.  Describes pain as a burning/stretching sensation.  Pain starts at the bilateral upper buttocks and radiates down the legs.  Rates pain 8/10.  Pain is constant.  Pain seems to be worse when he sits down.  He denies any lower back pain.  Denies bowel or bladder incontinence.  Denies any dysuria.  Denies abdominal pain, nausea, vomiting, diarrhea.  Has had no recent falls or fevers.  No chest pain, shortness of breath or sick contacts.  Past Medical History:  Diagnosis Date   Chronic back pain    GERD (gastroesophageal reflux disease)    Hypertension     There are no active problems to display for this patient.   No past surgical history on file.      Home Medications    Prior to Admission medications   Medication Sig Start Date End Date Taking? Authorizing Provider  acetaminophen (TYLENOL) 325 MG tablet Take 2 tablets (650 mg total) by mouth every 6 (six) hours as needed. 05/22/18   Loura Halt A, NP  cephALEXin (KEFLEX) 500 MG capsule Take 1 capsule (500 mg total) by mouth 4 (four) times daily for 7 days. 09/26/18 10/03/18  Lakendrick Paradis S, PA-C  cetirizine (ZYRTEC ALLERGY) 10 MG tablet Take 1 tablet (10 mg total) by mouth daily. 07/20/18   Loura Halt A, NP  doxycycline (VIBRAMYCIN) 100 MG capsule Take 1 capsule (100 mg total) by mouth 2  (two) times daily. 07/20/18   Loura Halt A, NP  ibuprofen (ADVIL,MOTRIN) 800 MG tablet Take 1 tablet (800 mg total) by mouth every 8 (eight) hours as needed for moderate pain. 01/12/18   Wurst, [place], PA-C  ibuprofen (ADVIL,MOTRIN) 800 MG tablet Take 1 tablet (800 mg total) by mouth 3 (three) times daily. 05/22/18   Loura Halt A, NP  naproxen (NAPROSYN) 500 MG tablet Take 1 tablet (500 mg total) by mouth 2 (two) times daily for 7 days. 09/26/18 10/03/18  Caylan Schifano S, PA-C  famotidine (PEPCID) 40 MG tablet Take 1 tablet (40 mg total) by mouth daily. 06/03/18 07/20/18  Henderly, Britni A, PA-C  pantoprazole (PROTONIX) 20 MG tablet Take 1 tablet (20 mg total) by mouth daily. 05/10/18 07/20/18  Lamptey, Myrene Galas, MD  sucralfate (CARAFATE) 1 g tablet Take 1 tablet (1 g total) by mouth 4 (four) times daily -  with meals and at bedtime for 10 days. 06/03/18 07/20/18  Henderly, Britni A, PA-C    Family History Family History  Family history unknown: Yes    Social History Social History   Tobacco Use   Smoking status: Never Smoker   Smokeless tobacco: Current User  Substance Use Topics   Alcohol use: No   Drug use: No  Allergies   Patient has no known allergies.   Review of Systems Review of Systems  Constitutional: Negative for fever.  HENT: Negative for congestion.   Respiratory: Negative for cough and shortness of breath.   Cardiovascular: Negative for chest pain.  Gastrointestinal: Negative for abdominal pain, constipation, diarrhea, nausea and vomiting.       No loss of control of bowel function  Genitourinary: Negative for dysuria.       No loss of control of bladder function  Musculoskeletal:       Bilat leg pain, lump to left thigh  Neurological: Negative for weakness and numbness.    Physical Exam Updated Vital Signs BP (!) 139/96 (BP Location: Right Arm)    Pulse 84    Temp 98 F (36.7 C) (Oral)    Resp 14    SpO2 100%   Physical Exam Vitals signs and nursing  note reviewed.  Constitutional:      Appearance: He is well-developed.  HENT:     Head: Normocephalic and atraumatic.  Eyes:     Conjunctiva/sclera: Conjunctivae normal.  Neck:     Musculoskeletal: Neck supple.  Cardiovascular:     Rate and Rhythm: Normal rate and regular rhythm.     Pulses: Normal pulses.     Heart sounds: Normal heart sounds. No murmur.  Pulmonary:     Effort: Pulmonary effort is normal. No respiratory distress.     Breath sounds: Normal breath sounds. No wheezing, rhonchi or rales.  Abdominal:     General: Bowel sounds are normal.     Palpations: Abdomen is soft.     Tenderness: There is no abdominal tenderness. There is no guarding or rebound.  Musculoskeletal:     Comments: Skin tag noted to the left upper inner thigh that is about 1.5 cm x 1 cm.  This is tender to palpation.  It is not erythematous.  It is not warm to touch.  Bilateral lower extremities have no edema.  No focal tenderness.  5/5 strength of the bilateral lower extremities.  Normal sensation.  Normal distal pulses.  Ambulatory with steady gait.  Skin:    General: Skin is warm and dry.  Neurological:     Mental Status: He is alert.      ED Treatments / Results  Labs (all labs ordered are listed, but only abnormal results are displayed) Labs Reviewed - No data to display  EKG None  Radiology Dg Lumbar Spine Complete  Result Date: 09/26/2018 CLINICAL DATA:  Low back pain EXAM: LUMBAR SPINE - COMPLETE 4+ VIEW COMPARISON:  None. FINDINGS: Five lumbar type vertebral segments. Vertebral body heights and alignment are maintained. No fracture identified. Intervertebral disc spaces are preserved. No significant degenerative findings. IMPRESSION: No acute fracture or malalignment of the lumbar spine. Electronically Signed   By: Duanne GuessNicholas  Plundo M.D.   On: 09/26/2018 11:27   Dg Hips Bilat W Or Wo Pelvis 3-4 Views  Result Date: 09/26/2018 CLINICAL DATA:  43 year old male with bilateral upper leg  pain x1 week. EXAM: DG HIP (WITH OR WITHOUT PELVIS) 3-4V BILAT COMPARISON:  None. FINDINGS: There is no acute fracture or dislocation. The bones are well mineralized. 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 The soft tissues are unremarkable. Focal skin lesion noted in the soft tissues of the left thigh. Clinical correlation is recommended. IMPRESSION: 1. No acute osseous pathology. 2. Mild arthritic changes of the hips, right greater than left. Electronically Signed   By: Ceasar MonsArash  Radparvar M.D.  On: 09/26/2018 11:27    Procedures Procedures (including critical care time)  Medications Ordered in ED Medications - No data to display   Initial Impression / Assessment and Plan / ED Course  I have reviewed the triage vital signs and the nursing notes.  Pertinent labs & imaging results that were available during my care of the patient were reviewed by me and considered in my medical decision making (see chart for details).   Final Clinical Impressions(s) / ED Diagnoses   Final diagnoses:  Pain in both lower extremities  Skin lesion   Pt is a 43 y/o male with a h/o chronic back pain, GERD, HTN, who presents to the ED today for eval of a skin abnormality to the left thigh and bilat leg pain that began yesterday. He is also c/o a "lump in my left thigh" that he noticed a week ago. The lump is painful when he sits and he thinks that may be causing is bilat leg pain.  Describes pain as a burning/stretching sensation.  Pain starts at the bilateral upper buttocks and radiates down the legs.  Rates pain 8/10.  Pain is constant.  Pain seems to be worse when he sits down.  He denies any lower back pain.  Denies bowel or bladder incontinence.  Denies any dysuria.  Denies abdominal pain, nausea, vomiting, diarrhea.  Has had no recent falls or fevers.  No chest pain, shortness of breath or sick contacts.  Skin tag noted to the left upper inner thigh that is about 1.5 cm x 1 cm.  This is tender to  palpation.  It is not erythematous.  It is not warm to touch.  Bilateral lower extremities have no edema.  No focal tenderness.  5/5 strength of the bilateral lower extremities.  Normal sensation.  Normal distal pulses.  Ambulatory with steady gait.  X-ray of the lumbar spine and pelvis completed without any evidence of acute bony abnormality.  No obvious lytic lesions noted.  At this time, pathology of leg pain is currently unclear, but does appear to be in sciatic distribution bilaterally.  He is not having any lower back pain.  He has no symptoms of cauda equina.  He does not have any lower extremity swelling to suggest a vascular process and his pulses are intact bilaterally.  He has no significant GI or GU symptoms.  I will trial him on anti-inflammatories as well as give an antibiotic for potential infected skin tag.  I have advised that he will need to follow-up with his PCP about his symptoms for further evaluation as he may need additional imaging studies or further work-up.  I also gave him information for general surgery should he want to have the skin tag removed.  Have advised him return to ER for new or worsening symptoms.  He voiced understanding the plan and reasons to return here all questions answered.  Patient is aware discharge.  ED Discharge Orders         Ordered    cephALEXin (KEFLEX) 500 MG capsule  4 times daily     09/26/18 1134    naproxen (NAPROSYN) 500 MG tablet  2 times daily     09/26/18 [address][id number]           Ritamarie Arkin S, PA-C 09/26/18 1632    Arby BarrettePfeiffer, Marcy, MD 09/29/18 [id number]

## 2018-09-26 NOTE — Discharge Instructions (Signed)
Today you had x-rays of your lower back and pelvis as well as your hips.  There were no obvious abnormalities to your bones and no obvious evidence of fracture to the bones.    With regard to your skin lesion, you will be given information to follow-up with general surgery to have this removed.  Please call the office to schedule an appointment for follow-up.  You will also be given an antibiotic to help treat a possible infection of this area of your skin.  Please take the antibiotic as prescribed.  Please complete the full course of the antibiotic.    With regard to your bilateral leg pain, you will need to follow-up with your regular doctor in 1 week to reevaluate your symptoms.  If you do not have a regular doctor, you are given information to follow-up with the [facility].  Please call the office to schedule an appointment for follow-up.  You were given a prescription for anti-inflammatories to help with your symptoms.  If you have any new or worsening symptoms in the meantime including numbness in your legs, weakness in your legs, loss of control of your bowels or bladder function, fevers, then you will need to return to the emergency department immediately.  -------------------------------------------------------------------------------------------------------   ?? ???????? ??????? ????? ? ?????? ? ??????? ????-?? ????? ?????? ?????????? ???? ?????? ????????????? ???? ? ?????????? ???????????? ?????? ?????? ?????  ??????? ?????? ????? ?????????, ???????? ??????? ???????????? ??? ?????? ???? ??????? ????? ? ????? ??????? ????? ???????? ???? ???????????? ????????? ???? ????? ???? ?? ?????????? ??????? ???????????? ??? ?????? ?????? ?????? ?? ????????? ????? ????????? ????? ????? ????????? ?????? ???????????? ????????? ????? ?????????????? ????? ????????? ???? ??????????  ?????? ?????????? ?????? ??????? ?????????, ??????? ? ??????? ?????? ?????????? ?????????????? ????  ?????? ?????? ????????? ?????? ?????????? ?????? ??? ???????? ?????? ?????? ??? ???, ???????? ???? ??? ????????? ? ????????? ????????? ?????? ???? ??????? ??????? ???????? ???? ???????????? ????????? ???? ????? ???? ?? ?????????? ???????? ??????? ?????????? ??? ????? ?????? ???? ?????-?????????????????? ???? ?? ?????? ?????? ??? ??????? ????? ???? ???? ?? ??????? ???????? ??? ??? ??????? ???????? ????? ??????, ???????? ??????, ??????? ????????? ????????? ?????? ?? ?????????? ???, ??????, ????? ???????? ???????? ??????? ?????? ?????  ---------------------------------------------------------------------------------------------------------------------   ?ja tap?'?nsam?ga tap?'??k? pach??i ra ?r??i ra hipsak? ?ksa-r? thiy?. Tap?'?k? ha??'??har?m? [place] spa??a as?m?n'yat?har? thi'?na ra ha??'??har?m? phry?kcarak? spa??a pram??a thi'?na.  Tap?'??k? ch?l?k? gh?vak? sambandham?, tap?'?nl?'? s?m?n'ya ?alyakriy?k? s?tha anugamana garna j?nak?r? di'in? cha yasal?'? ha??'unak? l?gi. Anugamanak? l?gi ap?'in?am?n?a nirdh?rita garna kr?pay? aphisa kala garnuh?s. Tap?'?l?'? ?n?ib?y??ika pani di'in?cha tap?'?k? ch?l?k? yasa k??tram? sambhava sa?krama?ak? upac?ra garna. Nirdh?rita anus?ra ?n?ib?y??ika linuh?s. Kr?pay? ?n?ib?y??ikak? p?r?a p??hyakrama p?r? garnuh?s.  Tap?'?k? dvipak??ya khu??? dukh?'?k? sandarbham?, tap?'?l?'? 1 hapt?m? tap?'?k? lak?a?ahar?k? punarm?ly??kana garna tap?'?k? niyamita ??k?arasam?ga anugamana garnuparn? huncha. Yadi tap?'?nsam?ga niyamita ??k?ara chaina bhan?, tap?'?nl?'? m??? k?na sv?sthya ra sv?sthya klinikam? anugamana garna j?nak?r? di'incha. Anugamanak? l?gi ap?'in?am?n?a nirdh?rita garna kr?pay? aphisa kala garnuh?s. Tap?'im?l?'? tap?'im?k? lak?a?ahar?k? s?tha maddata garnak? l?gi ?n??-inphl?m??arijahar?k? l?gi ?ka nuskh? di'iy?[employer] tap?'??k? b?cam? kunai nay?m? v? bigran? lak?a?ahar? chan bhan? tap?'??k? khu???m? sunna nasakn?, khu???m? kamaj?r?, tap?'??k? ?ndr?k? niyantra?a har?'un? v?  m?tr??ayak? k?ma, phiyarsa, tap?'?? turuntai ?patak?l?na vibh?gam? pharkanu parcha.

## 2019-03-19 ENCOUNTER — Encounter (HOSPITAL_COMMUNITY): Payer: Self-pay | Admitting: Emergency Medicine

## 2019-03-19 ENCOUNTER — Ambulatory Visit (HOSPITAL_COMMUNITY)
Admission: EM | Admit: 2019-03-19 | Discharge: 2019-03-19 | Disposition: A | Discharge status: Home / Self Care | Payer: Self-pay | Attending: Urgent Care | Admitting: Urgent Care

## 2019-03-19 ENCOUNTER — Other Ambulatory Visit: Payer: Self-pay

## 2019-03-19 DIAGNOSIS — H01004 Unspecified blepharitis left upper eyelid: Secondary | ICD-10-CM

## 2019-03-19 DIAGNOSIS — H5712 Ocular pain, left eye: Secondary | ICD-10-CM

## 2019-03-19 DIAGNOSIS — H9190 Unspecified hearing loss, unspecified ear: Secondary | ICD-10-CM

## 2019-03-19 MED ORDER — ERYTHROMYCIN 5 MG/GM OP OINT: TOPICAL_OINTMENT | Freq: Four times a day (QID) | OPHTHALMIC | 0 refills | Status: AC

## 2019-03-19 NOTE — ED Triage Notes (Signed)
Pt here for left eye irritation x 1 week; pt speak nepali and is also very hard of hearing; appears to read lips well via interpretor

## 2019-03-19 NOTE — ED Provider Notes (Signed)
Charles Watson   MRN: [id number] DOB: August 05, 1975  Subjective:   Charles Watson is a 44 y.o. male presenting for 1 week hx of persistent left eye pain, feels pain around his eye lid.  Has mild intermittent blurred vision.  Denies fever, eye trauma, eye drainage.  Has tried over-the-counter eyedrops with minimal relief.  Has hard hearing. Was already seen and evaluated by Dr. Redmond Baseman, 03/05/2019.   "Impression & Plans:  Charles Watson is a 44 y.o. 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  - I discussed hearing testing results from June 2019 indicating no hearing in the left ear and profound loss in the right ear. He will be scheduled for updated testing and a hearing aid consultation. He is medically cleared. He would be a candidate for cochlear implantation if the hearing aid is not effective.  [name], MD Otolaryngology"   No current facility-administered medications for this encounter.  Current Outpatient Medications:  .  acetaminophen (TYLENOL) 325 MG tablet, Take 2 tablets (650 mg total) by mouth every 6 (six) hours as needed., Disp: , Rfl:  .  cetirizine (ZYRTEC ALLERGY) 10 MG tablet, Take 1 tablet (10 mg total) by mouth daily., Disp: 30 tablet, Rfl: 0 .  doxycycline (VIBRAMYCIN) 100 MG capsule, Take 1 capsule (100 mg total) by mouth 2 (two) times daily. (Patient not taking: Reported on 03/19/2019), Disp: 20 capsule, Rfl: 0 .  ibuprofen (ADVIL,MOTRIN) 800 MG tablet, Take 1 tablet (800 mg total) by mouth every 8 (eight) hours as needed for moderate pain., Disp: 30 tablet, Rfl: 0 .  ibuprofen (ADVIL,MOTRIN) 800 MG tablet, Take 1 tablet (800 mg total) by mouth 3 (three) times daily., Disp: 21 tablet, Rfl: 0   No Known Allergies  Past Medical History:  Diagnosis Date  . Chronic back pain   . GERD (gastroesophageal reflux disease)   . Hypertension      History reviewed. No pertinent surgical history.  Family History  Family history unknown: Yes     Social History   Tobacco Use  . Smoking status: Never Smoker  . Smokeless tobacco: Current User  Substance Use Topics  . Alcohol use: No  . Drug use: No    ROS   Objective:   Vitals: BP 133/82 (BP Location: Right Arm)   Pulse 69   Temp 98.3 F (36.8 C) (Oral)   Resp 18   SpO2 97%   Physical Exam Constitutional:      General: He is not in acute distress.    Appearance: Normal appearance. He is normal weight. He is not ill-appearing.  HENT:     Head: Normocephalic and atraumatic.     Right Ear: Tympanic membrane, ear canal and external ear normal. There is no impacted cerumen.     Left Ear: Tympanic membrane, ear canal and external ear normal. There is no impacted cerumen.     Nose: Nose normal. No congestion or rhinorrhea.     Mouth/Throat:     Mouth: Mucous membranes are moist.     Pharynx: Oropharynx is clear. No oropharyngeal exudate or posterior oropharyngeal erythema.  Eyes:     General: Lids are everted, no foreign bodies appreciated. No scleral icterus.       Right eye: No foreign body, discharge or hordeolum.        Left eye: No foreign body, discharge or hordeolum.     Extraocular Movements: Extraocular movements intact.     Conjunctiva/sclera:     Right eye:  Right conjunctiva is not injected. No chemosis, exudate or hemorrhage.    Left eye: Left conjunctiva is injected. No chemosis, exudate or hemorrhage.    Pupils: Pupils are equal, round, and reactive to light.   Cardiovascular:     Rate and Rhythm: Normal rate.  Pulmonary:     Effort: Pulmonary effort is normal.  Musculoskeletal:     Cervical back: Normal range of motion and neck supple. No rigidity. No muscular tenderness.  Neurological:     General: No focal deficit present.     Mental Status: He is alert and oriented to person, place, and time.  Psychiatric:        Mood and Affect: Mood normal.        Behavior: Behavior normal.      Assessment and Plan :   1. Blepharitis of left  upper eyelid, unspecified type   2. Pain of left eye   3. Hearing loss, unspecified hearing loss type, unspecified laterality     Given physical exam findings, will start patient on erythromycin to cover for blepharitis.  Emphasized need to follow-up in 2 days if no improvement, consider urgent referral to ophthalmology if this is the case.  Discussed need to follow-up with Dr. Jenne Pane given that he was already evaluated and has a treatment plan. Counseled patient on potential for adverse effects with medications prescribed/recommended today, ER and return-to-clinic precautions discussed, patient verbalized understanding.    [name], [place] 03/19/19 6144

## 2019-06-28 ENCOUNTER — Other Ambulatory Visit: Payer: Self-pay

## 2019-06-28 ENCOUNTER — Ambulatory Visit (HOSPITAL_COMMUNITY)
Admission: EM | Admit: 2019-06-28 | Discharge: 2019-06-28 | Disposition: A | Discharge status: Home / Self Care | Payer: Medicaid Other | Attending: Emergency Medicine | Admitting: Emergency Medicine

## 2019-06-28 ENCOUNTER — Encounter (HOSPITAL_COMMUNITY): Payer: Self-pay

## 2019-06-28 DIAGNOSIS — M25562 Pain in left knee: Secondary | ICD-10-CM | POA: Insufficient documentation

## 2019-06-28 DIAGNOSIS — R300 Dysuria: Secondary | ICD-10-CM

## 2019-06-28 DIAGNOSIS — L0291 Cutaneous abscess, unspecified: Secondary | ICD-10-CM

## 2019-06-28 LAB — POCT URINALYSIS DIP (DEVICE)
Bilirubin Urine: NEGATIVE
Glucose, UA: 100 mg/dL — AB
Hgb urine dipstick: NEGATIVE
Ketones, ur: NEGATIVE mg/dL
Leukocytes,Ua: NEGATIVE
Nitrite: NEGATIVE
Protein, ur: 30 mg/dL — AB
Specific Gravity, Urine: >=1.03 (ref 1.005–1.030)
Urobilinogen, UA: 0.2 mg/dL (ref 0.0–1.0)
pH: 5.5 (ref 5.0–8.0)

## 2019-06-28 MED ORDER — DOXYCYCLINE HYCLATE 100 MG PO CAPS: 100.0000 mg | ORAL_CAPSULE | Freq: Two times a day (BID) | ORAL | 0 refills | Status: AC

## 2019-06-28 MED ORDER — ACETAMINOPHEN 500 MG PO TABS: 500.0000 mg | ORAL_TABLET | Freq: Four times a day (QID) | ORAL | 0 refills | Status: AC | PRN

## 2019-06-28 MED ORDER — PHENAZOPYRIDINE HCL 100 MG PO TABS: 100.0000 mg | ORAL_TABLET | Freq: Three times a day (TID) | ORAL | 0 refills | Status: AC | PRN

## 2019-06-28 NOTE — ED Provider Notes (Signed)
[facility]    CSN: [id number] Arrival date & time: 06/28/19  1447      History   Chief Complaint Chief Complaint  Patient presents with  . Abscess  . Dysuria    HPI Charles Watson is a 44 y.o. male.   presented to the urgent care with a complaint of abscess on his back for the past 1 week.  Denies drainage report redness.  Denies any precipitating event.  He has not used any medication.  Nothing make his symptoms worse.  Denies chills, fever, nausea, vomiting, diarrhea, chest pain, chest tightness, confusion.  He is also complaining of dysuria for the past few days.  Denies any precipitating event.    Symptoms are made worse with urination.  He denies similar symptoms in the past.  He complains of decreased amount, increased urgency.  Denies fever, chills, nausea, vomiting, abdominal pain, flank pain, hematuria, or incontinence  Patient also reported left knee pain for the past 2 weeks.  Denies trauma or injury.  Reported he has not used any medication.  Report he would like to get x-ray done.   The history is provided by the patient. No language interpreter was used.  Abscess Dysuria Presenting symptoms: dysuria     Past Medical History:  Diagnosis Date  . Chronic back pain   . GERD (gastroesophageal reflux disease)   . Hypertension     There are no problems to display for this patient.   History reviewed. No pertinent surgical history.     Home Medications    Prior to Admission medications   Medication Sig Start Date End Date Taking? Authorizing Provider  acetaminophen (TYLENOL) 500 MG tablet Take 1 tablet (500 mg total) by mouth every 6 (six) hours as needed. 06/28/19   Avegno, Zachery Dakins, FNP  cetirizine (ZYRTEC ALLERGY) 10 MG tablet Take 1 tablet (10 mg total) by mouth daily. 07/20/18   Dahlia Byes A, NP  doxycycline (VIBRAMYCIN) 100 MG capsule Take 1 capsule (100 mg total) by mouth 2 (two) times daily. 06/28/19   Avegno, Zachery Dakins, FNP  erythromycin  ophthalmic ointment Place into the left eye 4 (four) times daily. 03/19/19   [name], PA-C  ibuprofen (ADVIL,MOTRIN) 800 MG tablet Take 1 tablet (800 mg total) by mouth every 8 (eight) hours as needed for moderate pain. 01/12/18   Wurst, [place], PA-C  ibuprofen (ADVIL,MOTRIN) 800 MG tablet Take 1 tablet (800 mg total) by mouth 3 (three) times daily. 05/22/18   Dahlia Byes A, NP  phenazopyridine (PYRIDIUM) 100 MG tablet Take 1 tablet (100 mg total) by mouth 3 (three) times daily as needed for pain. 06/28/19   Avegno, Zachery Dakins, FNP  famotidine (PEPCID) 40 MG tablet Take 1 tablet (40 mg total) by mouth daily. 06/03/18 07/20/18  Henderly, Britni A, PA-C  pantoprazole (PROTONIX) 20 MG tablet Take 1 tablet (20 mg total) by mouth daily. 05/10/18 07/20/18  Lamptey, Britta Mccreedy, MD  sucralfate (CARAFATE) 1 g tablet Take 1 tablet (1 g total) by mouth 4 (four) times daily -  with meals and at bedtime for 10 days. 06/03/18 07/20/18  Henderly, Britni A, PA-C    Family History Family History  Family history unknown: Yes    Social History Social History   Tobacco Use  . Smoking status: Never Smoker  . Smokeless tobacco: Current User  Substance Use Topics  . Alcohol use: No  . Drug use: No     Allergies   Patient has no known  allergies.   Review of Systems Review of Systems  Constitutional: Negative.   HENT: Negative.   Respiratory: Negative.   Cardiovascular: Negative.   Gastrointestinal: Negative.   Genitourinary: Positive for dysuria.  Musculoskeletal: Positive for arthralgias.  Skin: Positive for color change.  Neurological: Negative.   All other systems reviewed and are negative.    Physical Exam Triage Vital Signs ED Triage Vitals [06/28/19 1611]  Enc Vitals Group     BP 138/83     Pulse Rate 84     Resp 18     Temp 98.1 F (36.7 C)     Temp Source Oral     SpO2 98 %     Weight      Height      Head Circumference      Peak Flow      Pain Score      Pain Loc      Pain Edu?       Excl. in [place]?    No data found.  Updated Vital Signs BP 138/83 (BP Location: Right Arm)   Pulse 84   Temp 98.1 F (36.7 C) (Oral)   Resp 18   SpO2 98%   Visual Acuity Right Eye Distance:   Left Eye Distance:   Bilateral Distance:    Right Eye Near:   Left Eye Near:    Bilateral Near:     Physical Exam Vitals and nursing note reviewed.  Constitutional:      General: He is not in acute distress.    Appearance: Normal appearance. He is normal weight. He is not ill-appearing or toxic-appearing.  HENT:     Head: Normocephalic.     Right Ear: Tympanic membrane, ear canal and external ear normal. There is no impacted cerumen.     Left Ear: Tympanic membrane, ear canal and external ear normal. There is no impacted cerumen.     Nose: Nose normal. No congestion.     Mouth/Throat:     Mouth: Mucous membranes are moist.     Pharynx: Oropharynx is clear. No oropharyngeal exudate or posterior oropharyngeal erythema.  Cardiovascular:     Rate and Rhythm: Normal rate and regular rhythm.     Pulses: Normal pulses.     Heart sounds: Normal heart sounds. No murmur.  Pulmonary:     Effort: Pulmonary effort is normal. No respiratory distress.     Breath sounds: Normal breath sounds. No wheezing or rhonchi.  Chest:     Chest wall: No tenderness.  Abdominal:     General: Abdomen is flat. Bowel sounds are normal. There is no distension.     Palpations: There is no mass.     Tenderness: There is no abdominal tenderness.  Musculoskeletal:        General: Tenderness present. No swelling or signs of injury.     Right knee: Normal.     Left knee: Tenderness present.  Skin:    General: Skin is warm.     Findings: Abscess present. No erythema or rash.  Neurological:     Mental Status: He is alert and oriented to person, place, and time.      UC Treatments / Results  Labs (all labs ordered are listed, but only abnormal results are displayed) Labs Reviewed  POCT URINALYSIS DIP  (DEVICE) - Abnormal; Notable for the following components:      Result Value   Glucose, UA 100 (*)    Protein, ur 30 (*)  All other components within normal limits  URINE CULTURE    EKG   Radiology No results found.  Procedures Procedures (including critical care time)  Medications Ordered in UC Medications - No data to display  Initial Impression / Assessment and Plan / UC Course  I have reviewed the triage vital signs and the nursing notes.  Pertinent labs & imaging results that were available during my care of the patient were reviewed by me and considered in my medical decision making (see chart for details).     Patient is stable at discharge.  Pyridium, doxycycline and Tylenol were prescribed.  He was advised to follow PCP.  To return or go to ED for worsening symptoms.  Final Clinical Impressions(s) / UC Diagnoses   Final diagnoses:  Dysuria  Abscess  Acute pain of left knee     Discharge Instructions     POC urine analysis was inconclusive for UTI.  Sample will be sent for culture.  Someone will call if result is abnormal. Pyridium will be prescribed for symptomatic relief  Doxycycline be prescribed for abscess Return to the urgent care if abscess started to drain or is getting worse  Tylenol 500 mg be prescribed for left knee pain Advised patient to follow RICE instruction as attached To return or go to ED for worsening symptoms.    ED Prescriptions    Medication Sig Dispense Auth. Provider   doxycycline (VIBRAMYCIN) 100 MG capsule Take 1 capsule (100 mg total) by mouth 2 (two) times daily. 20 capsule Avegno, Zachery Dakins, FNP   phenazopyridine (PYRIDIUM) 100 MG tablet Take 1 tablet (100 mg total) by mouth 3 (three) times daily as needed for pain. 10 tablet Avegno, Zachery Dakins, FNP   acetaminophen (TYLENOL) 500 MG tablet Take 1 tablet (500 mg total) by mouth every 6 (six) hours as needed. 30 tablet Avegno, Zachery Dakins, FNP     PDMP not reviewed this  encounter.   Durward Parcel, FNP 06/28/19 1652

## 2019-06-28 NOTE — Discharge Instructions (Addendum)
POC urine analysis was inconclusive for UTI.  Sample will be sent for culture.  Someone will call if result is abnormal. Pyridium will be prescribed for symptomatic relief  Doxycycline be prescribed for abscess Return to the urgent care if abscess started to drain or is getting worse  Tylenol 500 mg be prescribed for left knee pain Advised patient to follow RICE instruction as attached To return or go to ED for worsening symptoms.

## 2019-06-28 NOTE — ED Triage Notes (Signed)
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 Pt c/o painful "bump" to upper/mid back for approx 1 week. Area of approx 4cm diameter noted raised/indurated. Denies drainage from area.   Also c/o decreased urine o/p, burning with urination, lower back pain and abdominal pain as well.  Denies vomiting.   Also having difficulty hearing in both ears.  Also c/o left knee pain.

## 2019-06-29 LAB — URINE CULTURE: Culture: NO GROWTH

## 2020-06-17 IMAGING — DX THORACIC SPINE 2 VIEWS
2 series · 2 of 2 positions shown · non-contrast
Comparison: Chest x-ray 06/24/2014

CLINICAL DATA: Fall yesterday with upper back pain.

EXAM:
THORACIC SPINE 2 VIEWS

[t-spine ap]
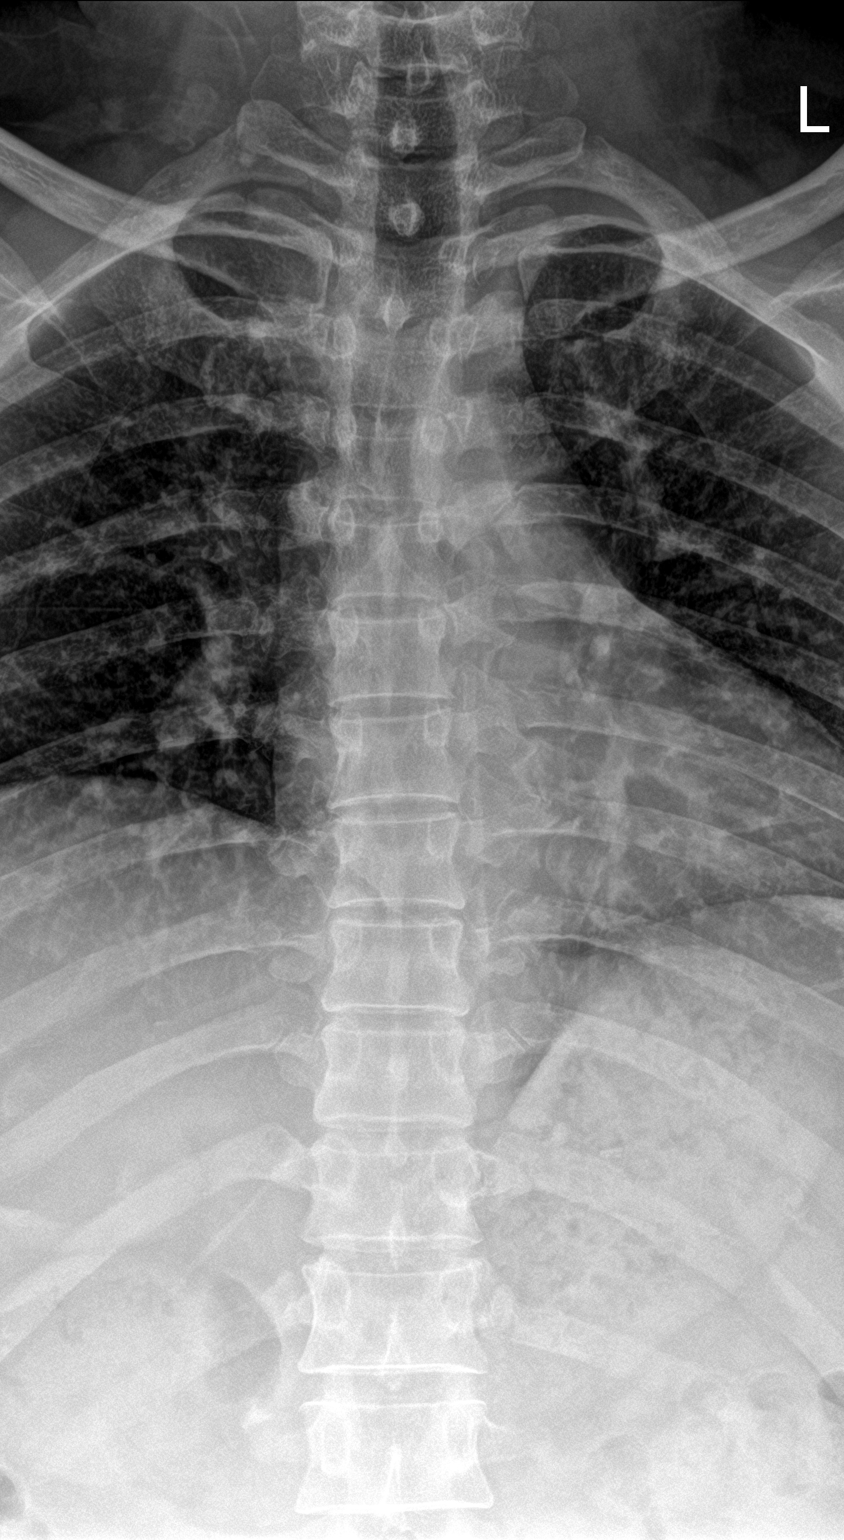

[t-spine lat]
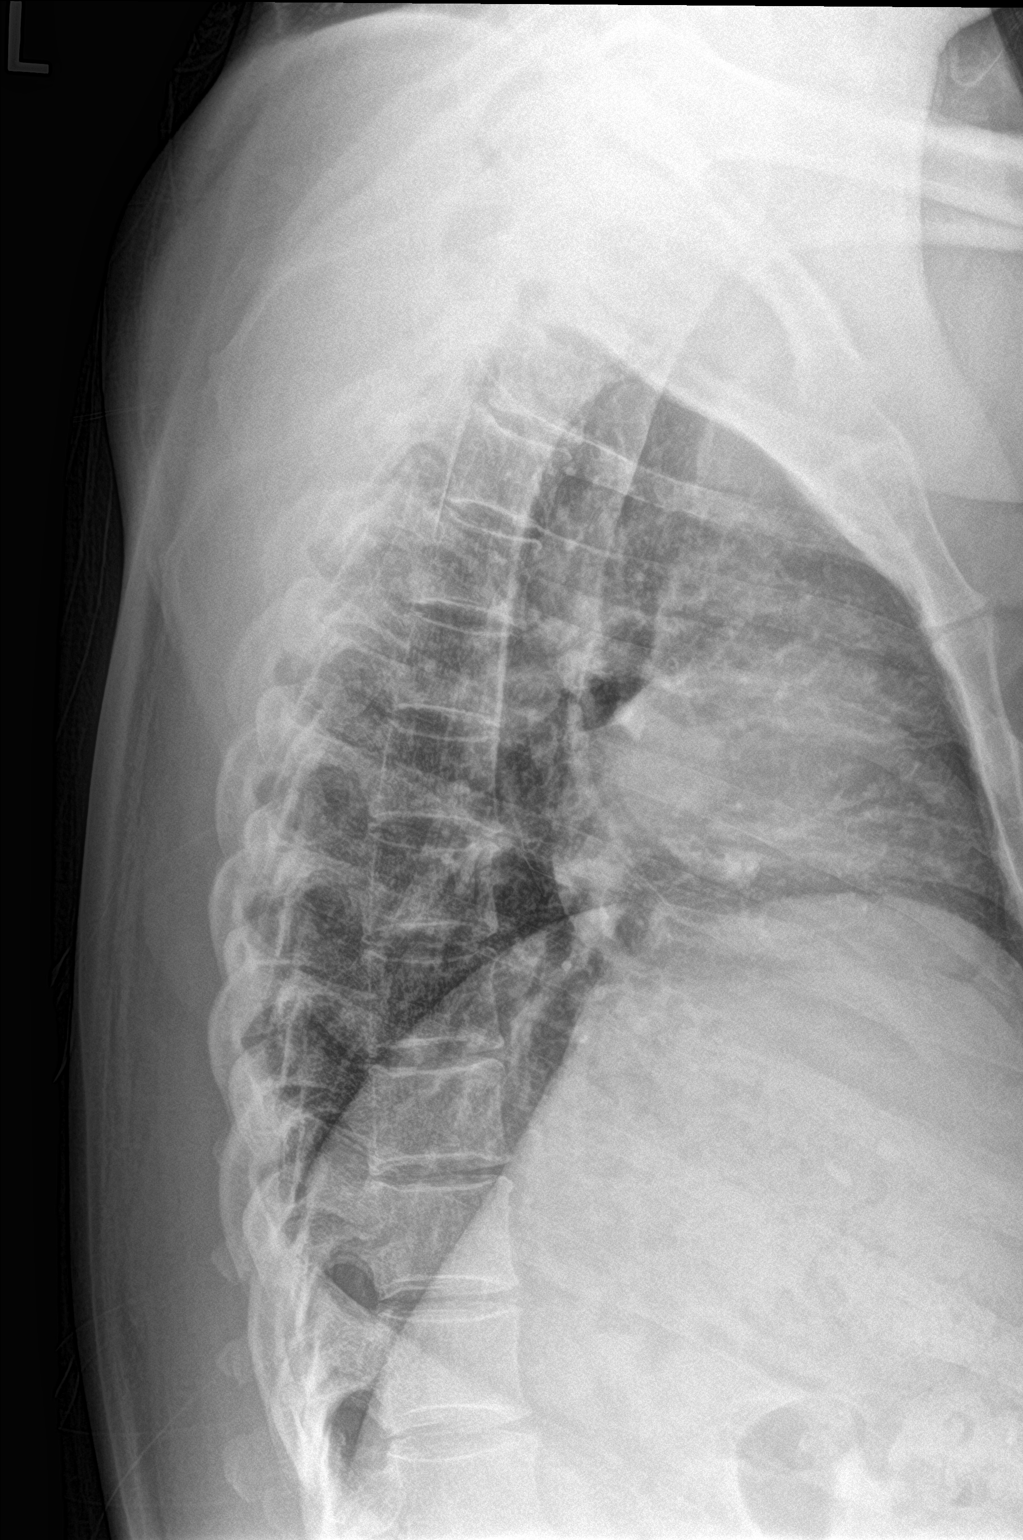

[2 of 2 positions shown; findings below may reference images not displayed]

FINDINGS: There is no evidence of thoracic spine fracture. Alignment is
normal. No other significant bone abnormalities are identified.
IMPRESSION: Negative.

## 2020-10-22 IMAGING — CR DG HIP (WITH OR WITHOUT PELVIS) 3-4V BILAT
5 series · 5 of 5 positions shown · non-contrast
Comparison: None.

CLINICAL DATA: 43-year-old male with bilateral upper leg pain x1
week.

EXAM:
DG HIP (WITH OR WITHOUT PELVIS) 3-4V BILAT

[pelvis ap]
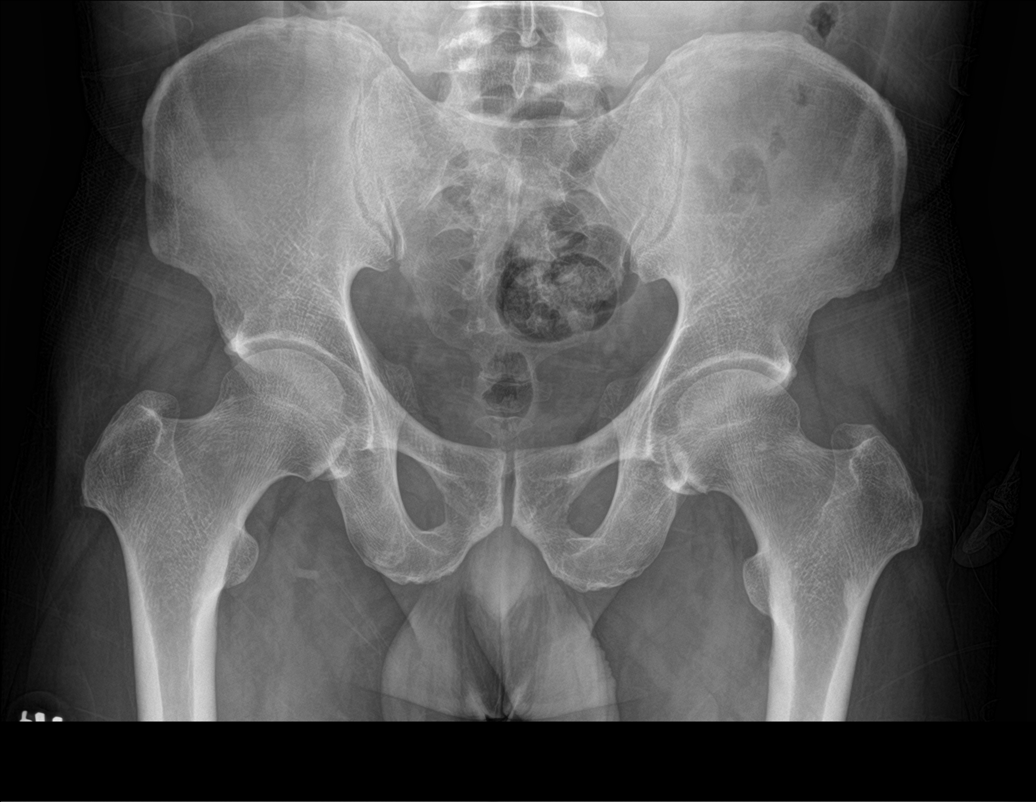

[hip ap (1 of 2)]
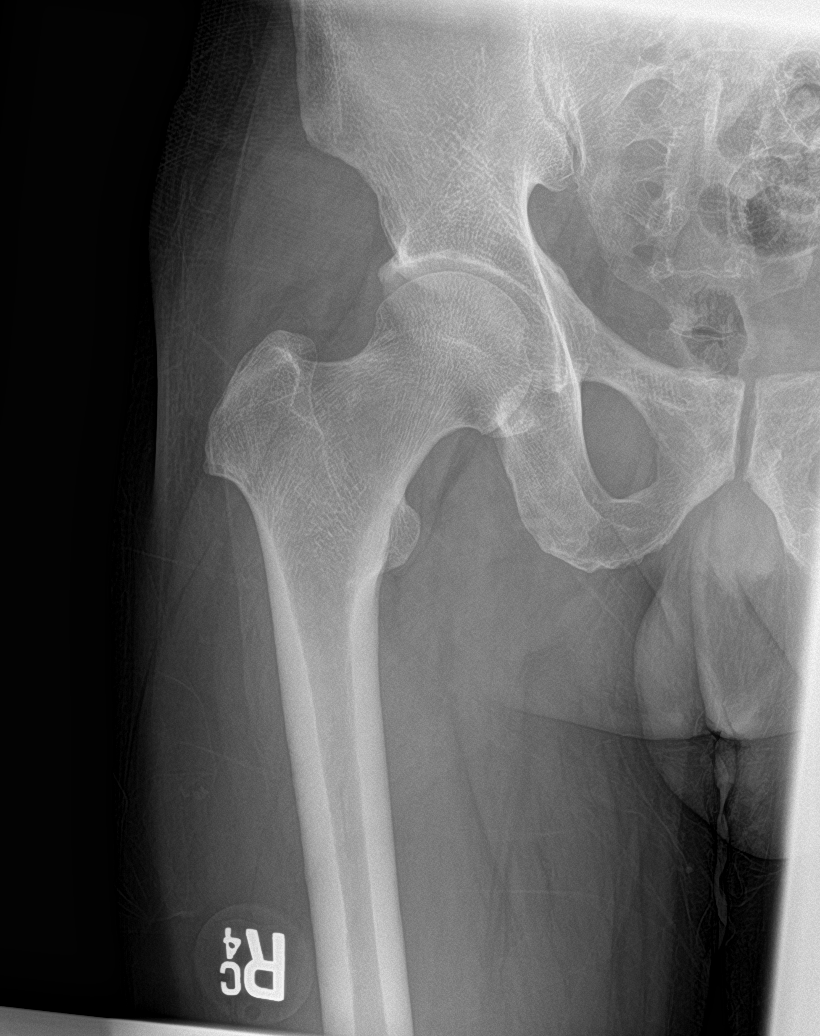

[hip lat (1 of 2)]
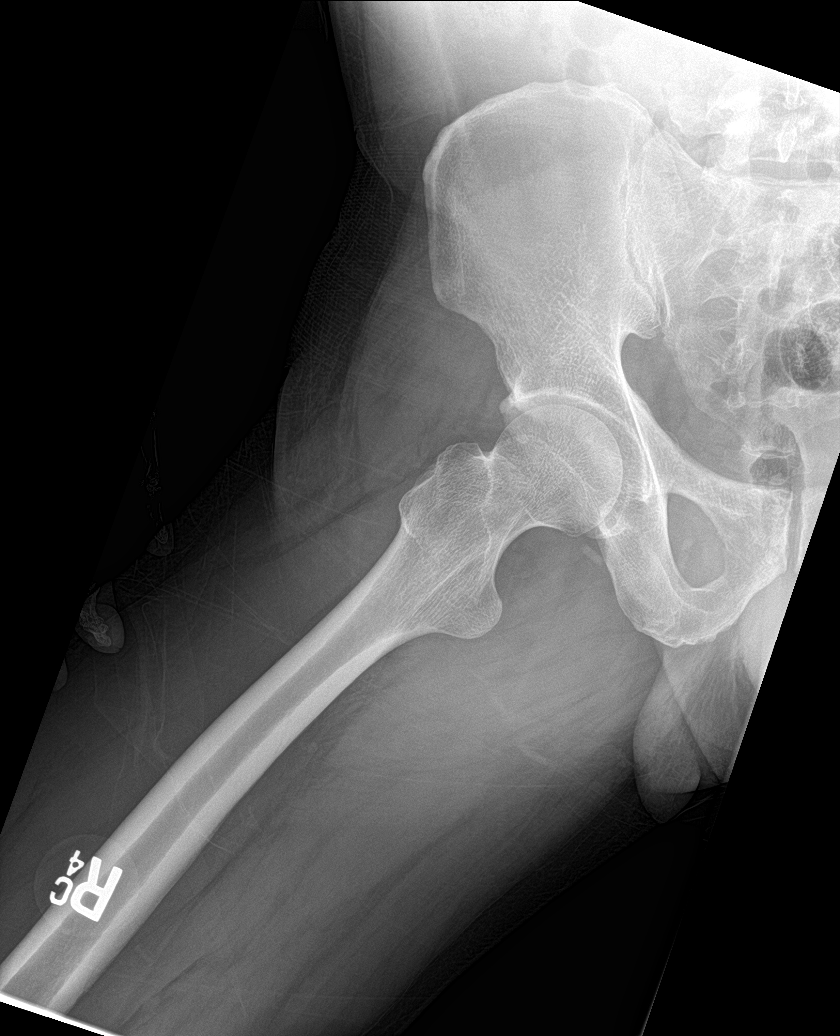

[hip ap (2 of 2)]
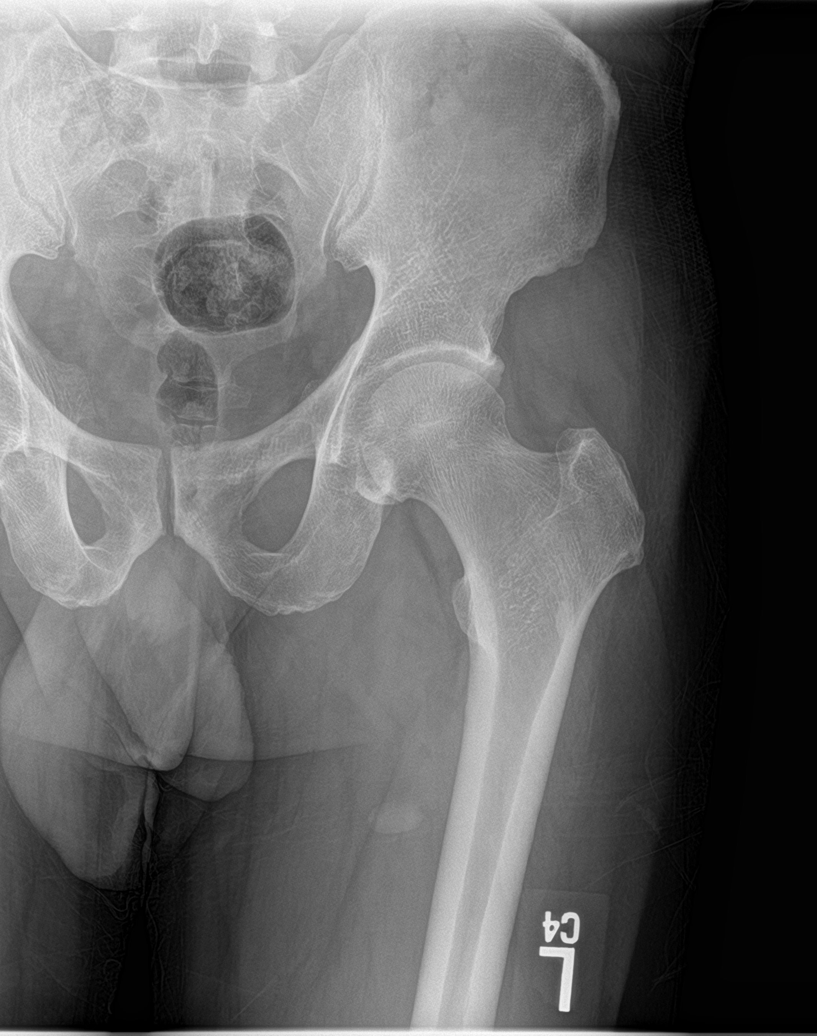

[hip lat (2 of 2)]
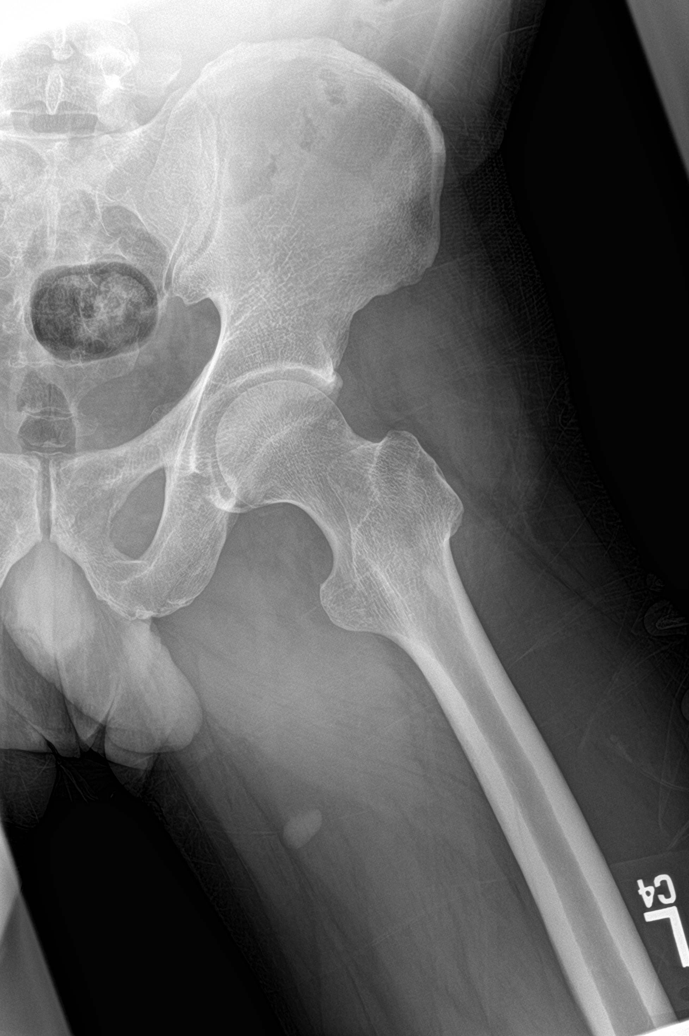

[5 of 5 positions shown; findings below may reference images not displayed]

FINDINGS: There is no acute fracture or dislocation. The bones are well
mineralized. There is mild bilateral hip arthritic change. The soft
tissues are unremarkable. Focal skin lesion noted in the soft
tissues of the left thigh. Clinical correlation is recommended.
IMPRESSION: 1. No acute osseous pathology.
2. Mild arthritic changes of the hips, right greater than left.
